# Patient Record
Sex: Male | Born: 1980 | Race: Black or African American | Hispanic: No | Marital: Single | Smoking: Current every day smoker
Health system: Southern US, Community
[De-identification: ages and names within clinical notes are randomized; demographics above are authoritative.]

## PROBLEM LIST (undated history)

## (undated) DIAGNOSIS — A6000 Herpesviral infection of urogenital system, unspecified: Secondary | ICD-10-CM

## (undated) DIAGNOSIS — H209 Unspecified iridocyclitis: Secondary | ICD-10-CM

## (undated) DIAGNOSIS — D860 Sarcoidosis of lung: Secondary | ICD-10-CM

## (undated) DIAGNOSIS — G510 Bell's palsy: Secondary | ICD-10-CM

## (undated) HISTORY — DX: Unspecified iridocyclitis: H20.9

## (undated) HISTORY — DX: Herpesviral infection of urogenital system, unspecified: A60.00

## (undated) HISTORY — DX: Bell's palsy: G51.0

## (undated) HISTORY — DX: Sarcoidosis of lung: D86.0

---

## 2000-02-04 ENCOUNTER — Emergency Department (HOSPITAL_COMMUNITY): Admission: EM | Admit: 2000-02-04 | Discharge: 2000-02-04 | Payer: Self-pay | Admitting: Emergency Medicine

## 2002-05-28 ENCOUNTER — Emergency Department (HOSPITAL_COMMUNITY): Admission: EM | Admit: 2002-05-28 | Discharge: 2002-05-28 | Payer: Self-pay | Admitting: Emergency Medicine

## 2002-05-29 ENCOUNTER — Emergency Department (HOSPITAL_COMMUNITY): Admission: EM | Admit: 2002-05-29 | Discharge: 2002-05-29 | Payer: Self-pay | Admitting: Emergency Medicine

## 2005-04-12 ENCOUNTER — Emergency Department (HOSPITAL_COMMUNITY): Admission: EM | Admit: 2005-04-12 | Discharge: 2005-04-12 | Payer: Self-pay | Admitting: Family Medicine

## 2005-12-08 ENCOUNTER — Ambulatory Visit: Payer: Self-pay | Admitting: Internal Medicine

## 2006-02-15 ENCOUNTER — Ambulatory Visit: Payer: Self-pay | Admitting: Internal Medicine

## 2006-09-14 DIAGNOSIS — H209 Unspecified iridocyclitis: Secondary | ICD-10-CM

## 2006-09-14 HISTORY — DX: Unspecified iridocyclitis: H20.9

## 2006-10-15 DIAGNOSIS — G510 Bell's palsy: Secondary | ICD-10-CM

## 2006-10-15 HISTORY — DX: Bell's palsy: G51.0

## 2006-11-02 ENCOUNTER — Ambulatory Visit: Payer: Self-pay | Admitting: Internal Medicine

## 2006-11-02 LAB — CONVERTED CEMR LAB: Sed Rate: 25 mm/hr — ABNORMAL HIGH (ref 0–20)

## 2006-11-13 ENCOUNTER — Ambulatory Visit: Payer: Self-pay | Admitting: Cardiology

## 2006-11-22 ENCOUNTER — Ambulatory Visit: Payer: Self-pay | Admitting: Internal Medicine

## 2006-11-23 ENCOUNTER — Ambulatory Visit: Admission: RE | Admit: 2006-11-23 | Discharge: 2006-11-23 | Payer: Self-pay | Admitting: Internal Medicine

## 2006-12-07 ENCOUNTER — Ambulatory Visit: Payer: Self-pay | Admitting: Internal Medicine

## 2006-12-11 DIAGNOSIS — D869 Sarcoidosis, unspecified: Secondary | ICD-10-CM

## 2006-12-13 ENCOUNTER — Ambulatory Visit: Payer: Self-pay | Admitting: Thoracic Surgery

## 2006-12-28 ENCOUNTER — Encounter: Payer: Self-pay | Admitting: Internal Medicine

## 2007-01-04 ENCOUNTER — Encounter: Payer: Self-pay | Admitting: Thoracic Surgery

## 2007-01-04 ENCOUNTER — Ambulatory Visit (HOSPITAL_COMMUNITY): Admission: RE | Admit: 2007-01-04 | Discharge: 2007-01-04 | Payer: Self-pay | Admitting: Thoracic Surgery

## 2007-01-04 ENCOUNTER — Ambulatory Visit: Payer: Self-pay | Admitting: Thoracic Surgery

## 2007-01-09 ENCOUNTER — Ambulatory Visit: Payer: Self-pay | Admitting: Thoracic Surgery

## 2007-02-15 ENCOUNTER — Ambulatory Visit: Payer: Self-pay | Admitting: Internal Medicine

## 2007-02-15 DIAGNOSIS — Z87891 Personal history of nicotine dependence: Secondary | ICD-10-CM | POA: Insufficient documentation

## 2007-02-19 ENCOUNTER — Encounter: Payer: Self-pay | Admitting: Internal Medicine

## 2007-02-20 ENCOUNTER — Ambulatory Visit: Payer: Self-pay | Admitting: Thoracic Surgery

## 2007-05-31 ENCOUNTER — Ambulatory Visit: Payer: Self-pay | Admitting: Internal Medicine

## 2007-06-26 ENCOUNTER — Telehealth: Payer: Self-pay | Admitting: Internal Medicine

## 2007-07-04 ENCOUNTER — Encounter: Payer: Self-pay | Admitting: Internal Medicine

## 2007-07-04 ENCOUNTER — Ambulatory Visit: Admission: RE | Admit: 2007-07-04 | Discharge: 2007-07-04 | Payer: Self-pay | Admitting: Internal Medicine

## 2007-07-05 ENCOUNTER — Ambulatory Visit: Payer: Self-pay | Admitting: Internal Medicine

## 2007-07-05 ENCOUNTER — Encounter: Payer: Self-pay | Admitting: Internal Medicine

## 2007-07-05 LAB — CONVERTED CEMR LAB
Albumin: 4.4 g/dL (ref 3.5–5.2)
Alkaline Phosphatase: 33 units/L — ABNORMAL LOW (ref 39–117)

## 2007-08-09 ENCOUNTER — Ambulatory Visit: Payer: Self-pay | Admitting: Internal Medicine

## 2007-08-09 LAB — CONVERTED CEMR LAB
ALT: 35 units/L (ref 0–53)
AST: 24 units/L (ref 0–37)
Albumin: 4.2 g/dL (ref 3.5–5.2)
Basophils Relative: 0.2 % (ref 0.0–1.0)
Eosinophils Relative: 0.2 % (ref 0.0–5.0)
HCT: 41.9 % (ref 39.0–52.0)
Hemoglobin: 14.1 g/dL (ref 13.0–17.0)
Lymphocytes Relative: 16.4 % (ref 12.0–46.0)
Monocytes Absolute: 0.8 10*3/uL (ref 0.1–1.0)
Monocytes Relative: 7.4 % (ref 3.0–12.0)
Neutro Abs: 7.8 10*3/uL — ABNORMAL HIGH (ref 1.4–7.7)
RBC: 4.99 M/uL (ref 4.22–5.81)
RDW: 14.6 % (ref 11.5–14.6)
Total Protein: 7.7 g/dL (ref 6.0–8.3)

## 2007-08-21 ENCOUNTER — Telehealth: Payer: Self-pay | Admitting: Internal Medicine

## 2007-09-11 ENCOUNTER — Encounter: Payer: Self-pay | Admitting: Internal Medicine

## 2007-09-13 ENCOUNTER — Ambulatory Visit: Payer: Self-pay | Admitting: Internal Medicine

## 2007-09-13 LAB — CONVERTED CEMR LAB
AST: 20 units/L (ref 0–37)
Albumin: 4.5 g/dL (ref 3.5–5.2)
Alkaline Phosphatase: 32 units/L — ABNORMAL LOW (ref 39–117)
Basophils Relative: 0.7 % (ref 0.0–3.0)
Bilirubin, Direct: 0.1 mg/dL (ref 0.0–0.3)
Eosinophils Relative: 0 % (ref 0.0–5.0)
Lymphocytes Relative: 12.3 % (ref 12.0–46.0)
Neutrophils Relative %: 79.4 % — ABNORMAL HIGH (ref 43.0–77.0)
Platelets: 308 10*3/uL (ref 150–400)
RBC: 4.99 M/uL (ref 4.22–5.81)
Total Protein: 7.7 g/dL (ref 6.0–8.3)
WBC: 11.7 10*3/uL — ABNORMAL HIGH (ref 4.5–10.5)

## 2007-10-11 ENCOUNTER — Ambulatory Visit: Payer: Self-pay | Admitting: Internal Medicine

## 2007-10-11 DIAGNOSIS — F17200 Nicotine dependence, unspecified, uncomplicated: Secondary | ICD-10-CM

## 2007-11-22 ENCOUNTER — Ambulatory Visit: Payer: Self-pay | Admitting: Internal Medicine

## 2007-11-22 LAB — CONVERTED CEMR LAB
AST: 24 units/L (ref 0–37)
Albumin: 4.1 g/dL (ref 3.5–5.2)
Alkaline Phosphatase: 37 units/L — ABNORMAL LOW (ref 39–117)
Basophils Absolute: 0 10*3/uL (ref 0.0–0.1)
Chlamydia, Swab/Urine, PCR: NEGATIVE
HCT: 43.2 % (ref 39.0–52.0)
Hemoglobin: 14.6 g/dL (ref 13.0–17.0)
Lymphocytes Relative: 25.2 % (ref 12.0–46.0)
MCHC: 33.7 g/dL (ref 30.0–36.0)
Monocytes Absolute: 0.6 10*3/uL (ref 0.1–1.0)
Monocytes Relative: 11.3 % (ref 3.0–12.0)
Neutro Abs: 3.3 10*3/uL (ref 1.4–7.7)
Platelets: 260 10*3/uL (ref 150–400)
RDW: 13.5 % (ref 11.5–14.6)
Total Protein: 7.5 g/dL (ref 6.0–8.3)

## 2008-02-14 DIAGNOSIS — D860 Sarcoidosis of lung: Secondary | ICD-10-CM

## 2008-02-14 HISTORY — DX: Sarcoidosis of lung: D86.0

## 2008-04-29 ENCOUNTER — Encounter (INDEPENDENT_AMBULATORY_CARE_PROVIDER_SITE_OTHER): Payer: Self-pay | Admitting: *Deleted

## 2008-05-05 ENCOUNTER — Telehealth: Payer: Self-pay | Admitting: Internal Medicine

## 2008-05-22 IMAGING — CR DG CHEST 2V
2 series · 2 of 2 positions shown · non-contrast
Comparison: Scout view of the chest from 11/13/06.

CLINICAL DATA: Mediastinal adenopathy.  Preadmission.  
 CHEST - 2 VIEW:

[view not recorded (1 of 2)]
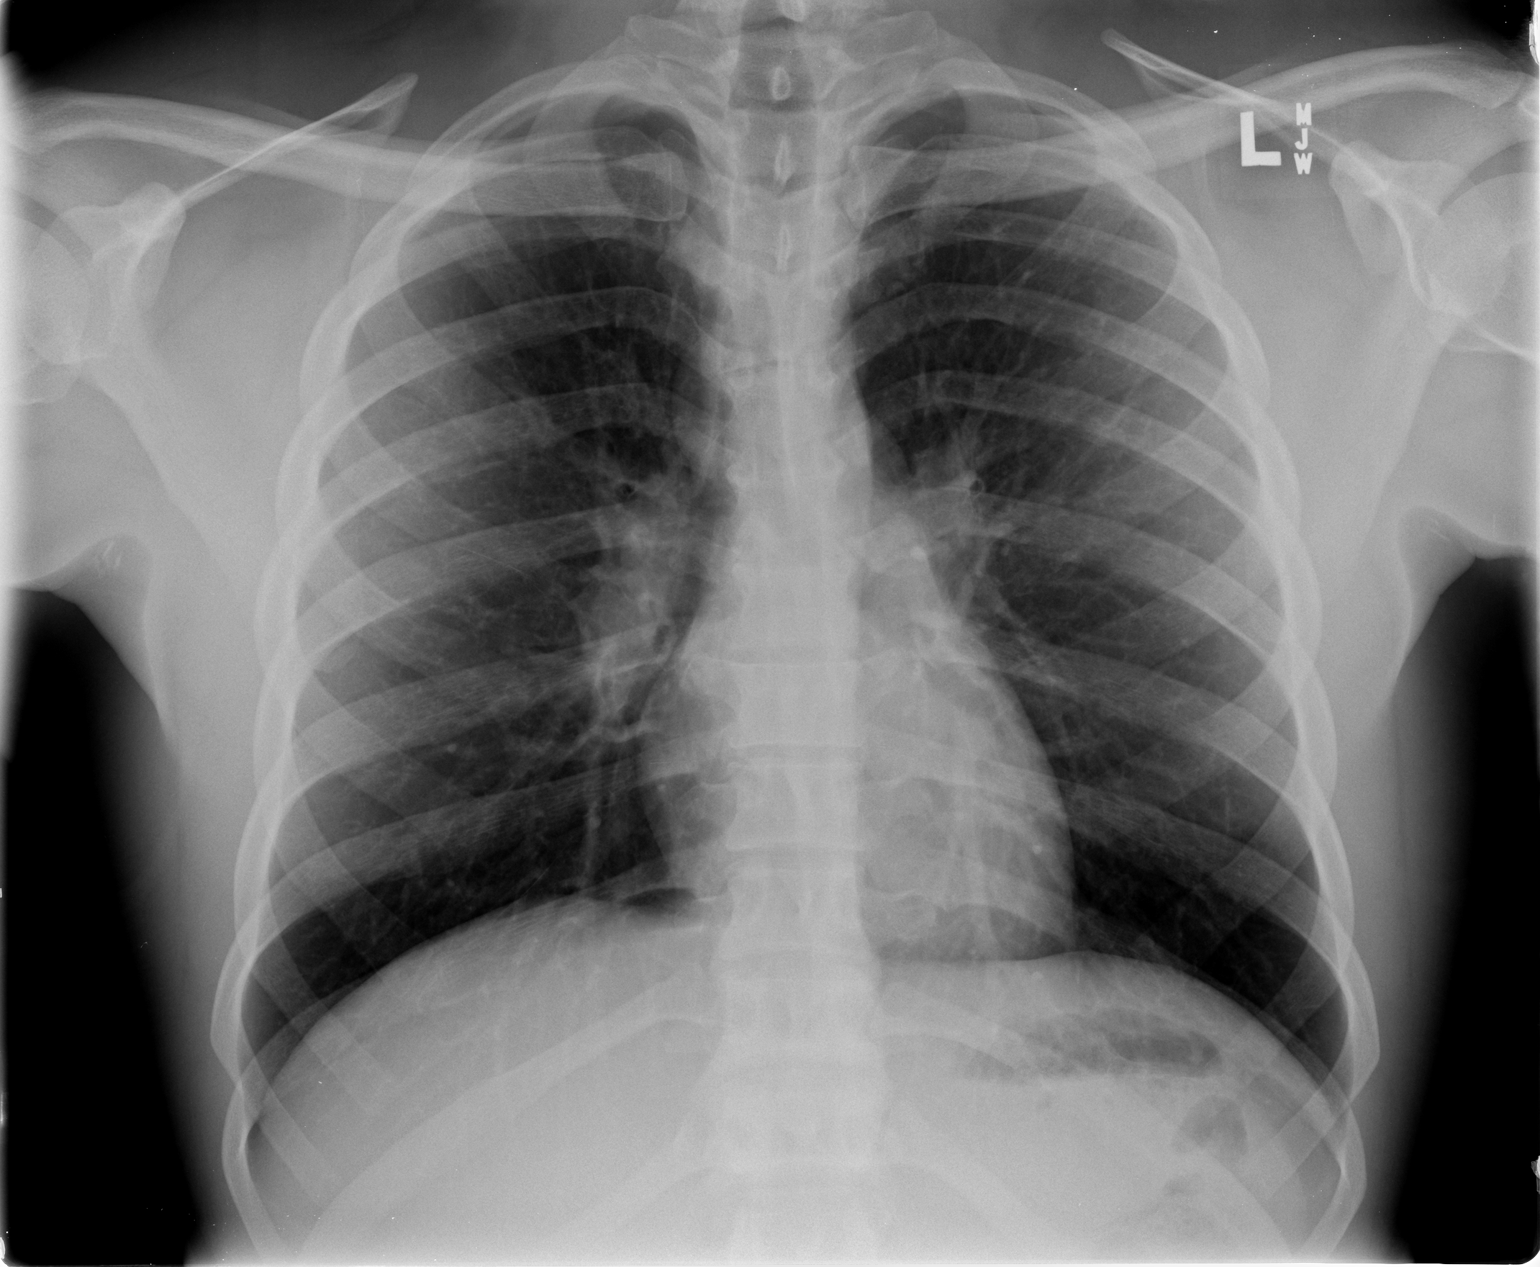

[view not recorded (2 of 2)]
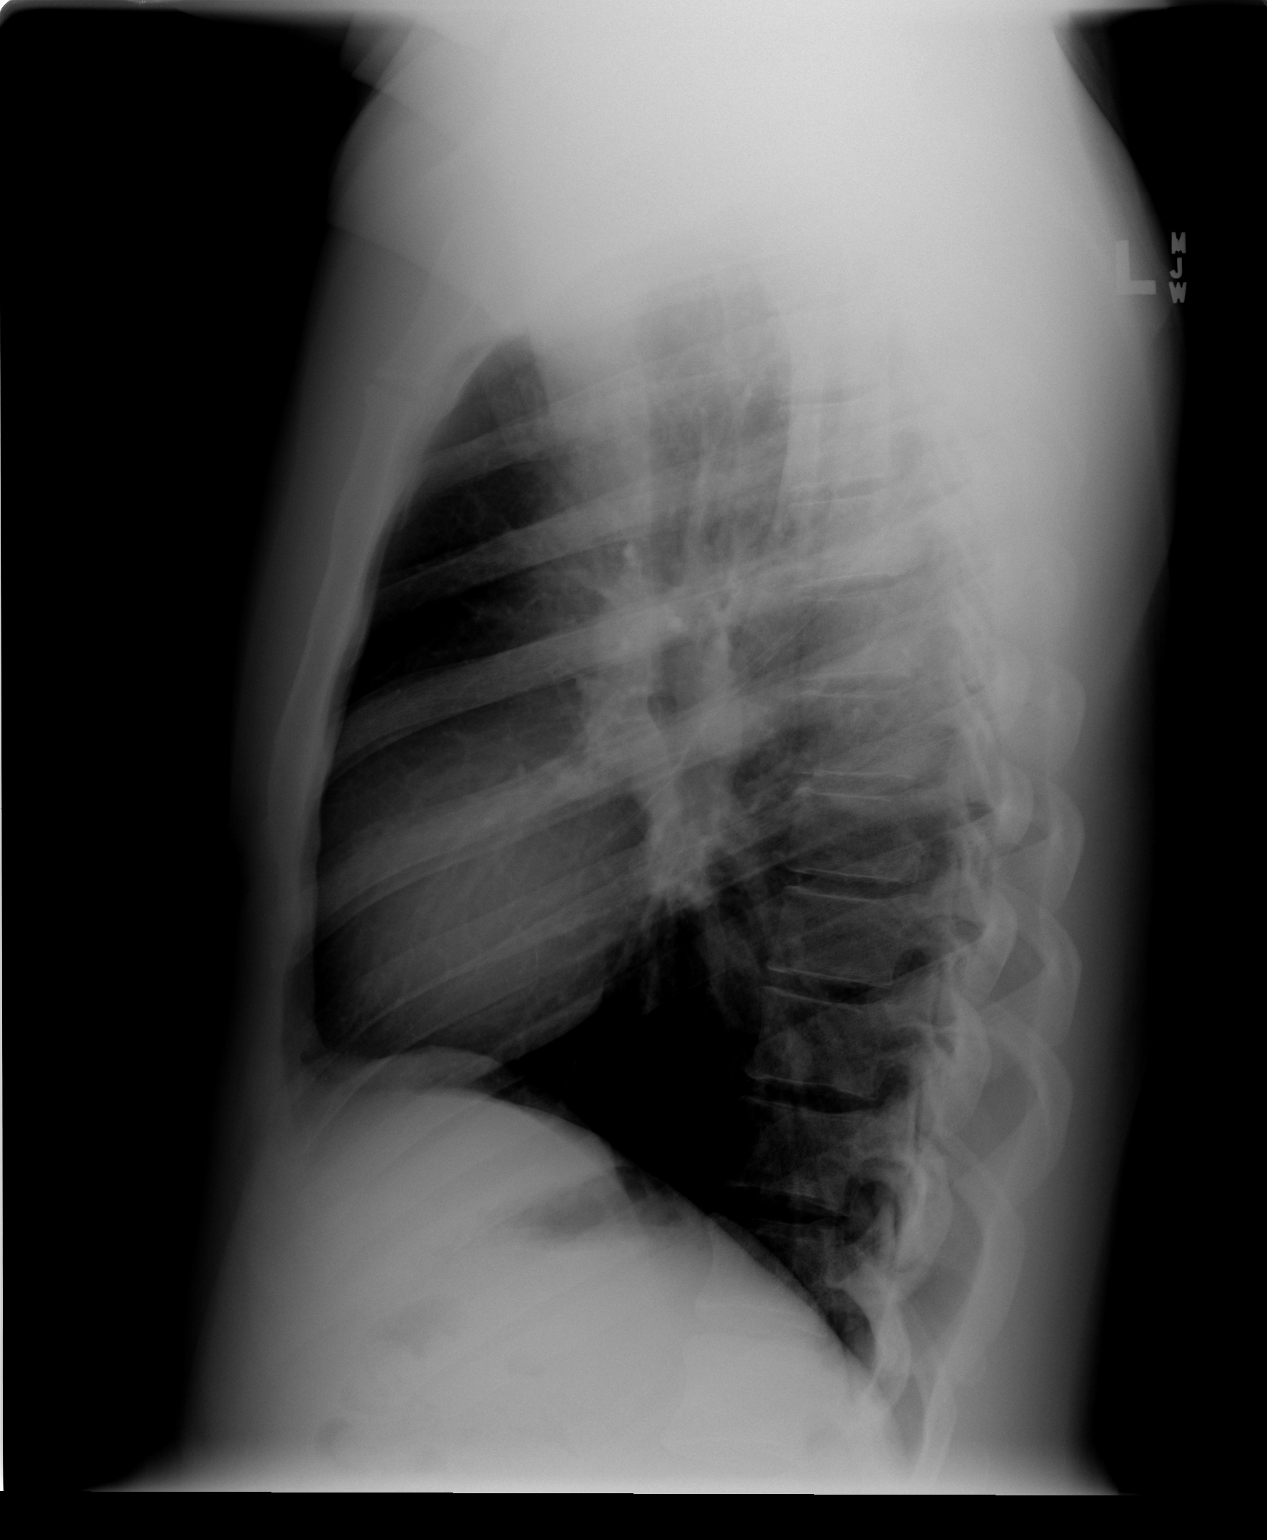

[2 of 2 positions shown; findings below may reference images not displayed]

FINDINGS: Cardiac size is within normal limits.  Mild bilateral hilar prominence is probably due to mild adenopathy.  There is no acute infiltrate or pleural effusion.  
 The bony structures are unremarkable.
IMPRESSION: No acute infiltrate or pleural effusion.  Mild bilateral hilar prominence most likely due to mild adenopathy.

## 2008-06-05 ENCOUNTER — Encounter: Payer: Self-pay | Admitting: Internal Medicine

## 2008-06-05 ENCOUNTER — Ambulatory Visit: Payer: Self-pay | Admitting: Cardiology

## 2008-06-08 ENCOUNTER — Telehealth: Payer: Self-pay | Admitting: Internal Medicine

## 2008-06-16 ENCOUNTER — Ambulatory Visit: Payer: Self-pay | Admitting: Internal Medicine

## 2008-06-17 LAB — CONVERTED CEMR LAB
BUN: 19 mg/dL (ref 6–23)
Bilirubin, Direct: 0.1 mg/dL (ref 0.0–0.3)
CO2: 30 meq/L (ref 19–32)
Chloride: 105 meq/L (ref 96–112)
Creatinine, Ser: 1.4 mg/dL (ref 0.4–1.5)
Eosinophils Absolute: 0 10*3/uL (ref 0.0–0.7)
Glucose, Bld: 90 mg/dL (ref 70–99)
HCT: 39.3 % (ref 39.0–52.0)
Lymphs Abs: 1.7 10*3/uL (ref 0.7–4.0)
MCHC: 35.1 g/dL (ref 30.0–36.0)
MCV: 82.3 fL (ref 78.0–100.0)
Monocytes Absolute: 0.9 10*3/uL (ref 0.1–1.0)
Neutrophils Relative %: 77.9 % — ABNORMAL HIGH (ref 43.0–77.0)
Platelets: 292 10*3/uL (ref 150.0–400.0)
RDW: 13.5 % (ref 11.5–14.6)
Total Bilirubin: 0.7 mg/dL (ref 0.3–1.2)

## 2008-07-10 ENCOUNTER — Ambulatory Visit: Payer: Self-pay | Admitting: Internal Medicine

## 2008-09-11 ENCOUNTER — Ambulatory Visit: Payer: Self-pay | Admitting: Cardiology

## 2008-09-21 ENCOUNTER — Encounter: Payer: Self-pay | Admitting: Internal Medicine

## 2008-09-25 ENCOUNTER — Ambulatory Visit: Payer: Self-pay | Admitting: Internal Medicine

## 2008-09-25 ENCOUNTER — Telehealth: Payer: Self-pay | Admitting: Internal Medicine

## 2008-09-25 LAB — CONVERTED CEMR LAB
ALT: 66 units/L — ABNORMAL HIGH (ref 0–53)
AST: 33 units/L (ref 0–37)
Albumin: 5 g/dL (ref 3.5–5.2)
BUN: 21 mg/dL (ref 6–23)
Chloride: 88 meq/L — ABNORMAL LOW (ref 96–112)
Eosinophils Relative: 0.4 % (ref 0.0–5.0)
GFR calc non Af Amer: 61.02 mL/min (ref >60)
Glucose, Bld: 617 mg/dL (ref 70–99)
HCT: 43.3 % (ref 39.0–52.0)
Hemoglobin: 14.3 g/dL (ref 13.0–17.0)
Hgb A1c MFr Bld: 11.4 % — ABNORMAL HIGH (ref 4.6–6.5)
Lymphs Abs: 1.7 10*3/uL (ref 0.7–4.0)
MCV: 86.3 fL (ref 78.0–100.0)
Monocytes Absolute: 0.8 10*3/uL (ref 0.1–1.0)
Monocytes Relative: 10.9 % (ref 3.0–12.0)
Neutro Abs: 4.3 10*3/uL (ref 1.4–7.7)
Platelets: 289 10*3/uL (ref 150.0–400.0)
Potassium: 3.9 meq/L (ref 3.5–5.1)
RDW: 14.1 % (ref 11.5–14.6)
Sodium: 126 meq/L — ABNORMAL LOW (ref 135–145)
Total Bilirubin: 1.7 mg/dL — ABNORMAL HIGH (ref 0.3–1.2)
WBC: 7 10*3/uL (ref 4.5–10.5)

## 2008-09-28 ENCOUNTER — Telehealth: Payer: Self-pay | Admitting: Internal Medicine

## 2008-10-02 ENCOUNTER — Telehealth: Payer: Self-pay | Admitting: Internal Medicine

## 2008-10-02 ENCOUNTER — Ambulatory Visit: Payer: Self-pay | Admitting: Internal Medicine

## 2008-10-02 ENCOUNTER — Inpatient Hospital Stay (HOSPITAL_COMMUNITY): Admission: AD | Admit: 2008-10-02 | Discharge: 2008-10-04 | Payer: Self-pay | Admitting: Internal Medicine

## 2008-10-02 DIAGNOSIS — R7309 Other abnormal glucose: Secondary | ICD-10-CM

## 2008-10-02 LAB — CONVERTED CEMR LAB
Albumin: 4.6 g/dL (ref 3.5–5.2)
Alkaline Phosphatase: 43 units/L (ref 39–117)
Basophils Absolute: 0 10*3/uL (ref 0.0–0.1)
Basophils Relative: 0.3 % (ref 0.0–3.0)
Bilirubin Urine: NEGATIVE
CO2: 23 meq/L (ref 19–32)
Calcium: 9.9 mg/dL (ref 8.4–10.5)
Chloride: 99 meq/L (ref 96–112)
Direct LDL: 252.2 mg/dL
Eosinophils Absolute: 0 10*3/uL (ref 0.0–0.7)
Glucose, Bld: 532 mg/dL (ref 70–99)
HCT: 44.4 % (ref 39.0–52.0)
Hemoglobin: 15.1 g/dL (ref 13.0–17.0)
Lymphocytes Relative: 21.4 % (ref 12.0–46.0)
Lymphs Abs: 1.2 10*3/uL (ref 0.7–4.0)
MCHC: 34 g/dL (ref 30.0–36.0)
MCV: 85.8 fL (ref 78.0–100.0)
Neutro Abs: 4.1 10*3/uL (ref 1.4–7.7)
Nitrite: NEGATIVE
Pancreatic Islet Cell Antibody: <5 (ref <5)
Potassium: 4 meq/L (ref 3.5–5.1)
RBC: 5.18 M/uL (ref 4.22–5.81)
RDW: 13.8 % (ref 11.5–14.6)
Sodium: 136 meq/L (ref 135–145)
TSH: 2.28 microintl units/mL (ref 0.35–5.50)
Total CHOL/HDL Ratio: 8
Total Protein, Urine: NEGATIVE mg/dL
Total Protein: 8.4 g/dL — ABNORMAL HIGH (ref 6.0–8.3)
Triglycerides: 328 mg/dL — ABNORMAL HIGH (ref 0.0–149.0)
Urine Glucose: >=1000 mg/dL
Urobilinogen, UA: 0.2 (ref 0.0–1.0)
Vitamin B-12: 1299 pg/mL — ABNORMAL HIGH (ref 211–911)

## 2008-10-09 ENCOUNTER — Ambulatory Visit: Payer: Self-pay | Admitting: Internal Medicine

## 2008-10-09 LAB — CONVERTED CEMR LAB: Blood Glucose, Fingerstick: 407

## 2008-10-17 ENCOUNTER — Telehealth: Payer: Self-pay | Admitting: Family Medicine

## 2008-10-20 ENCOUNTER — Telehealth: Payer: Self-pay | Admitting: Internal Medicine

## 2008-10-23 ENCOUNTER — Ambulatory Visit: Payer: Self-pay | Admitting: Internal Medicine

## 2008-10-23 LAB — CONVERTED CEMR LAB: Blood Glucose, Fingerstick: 117

## 2008-11-05 ENCOUNTER — Encounter: Admission: RE | Admit: 2008-11-05 | Discharge: 2008-11-05 | Payer: Self-pay | Admitting: Internal Medicine

## 2008-11-09 ENCOUNTER — Encounter: Payer: Self-pay | Admitting: Internal Medicine

## 2008-12-07 ENCOUNTER — Ambulatory Visit: Payer: Self-pay | Admitting: Internal Medicine

## 2009-01-01 ENCOUNTER — Ambulatory Visit: Payer: Self-pay | Admitting: Internal Medicine

## 2009-01-01 LAB — CONVERTED CEMR LAB
ALT: 26 units/L (ref 0–53)
AST: 27 units/L (ref 0–37)
Alkaline Phosphatase: 33 units/L — ABNORMAL LOW (ref 39–117)
Basophils Relative: 0.7 % (ref 0.0–3.0)
Bilirubin, Direct: 0.1 mg/dL (ref 0.0–0.3)
Calcium: 9.5 mg/dL (ref 8.4–10.5)
Creatinine, Ser: 1.2 mg/dL (ref 0.4–1.5)
Eosinophils Absolute: 0.1 10*3/uL (ref 0.0–0.7)
Eosinophils Relative: 1.6 % (ref 0.0–5.0)
Lymphocytes Relative: 27.9 % (ref 12.0–46.0)
Monocytes Relative: 9 % (ref 3.0–12.0)
Neutrophils Relative %: 60.8 % (ref 43.0–77.0)
RBC: 4.88 M/uL (ref 4.22–5.81)
Sodium: 141 meq/L (ref 135–145)
Total Protein: 7.5 g/dL (ref 6.0–8.3)
WBC: 4.9 10*3/uL (ref 4.5–10.5)

## 2009-01-14 ENCOUNTER — Telehealth: Payer: Self-pay | Admitting: Internal Medicine

## 2009-03-12 ENCOUNTER — Ambulatory Visit: Payer: Self-pay | Admitting: Internal Medicine

## 2009-03-26 ENCOUNTER — Telehealth (INDEPENDENT_AMBULATORY_CARE_PROVIDER_SITE_OTHER): Payer: Self-pay | Admitting: *Deleted

## 2009-04-23 ENCOUNTER — Telehealth: Payer: Self-pay | Admitting: Internal Medicine

## 2009-04-30 ENCOUNTER — Ambulatory Visit: Payer: Self-pay | Admitting: Internal Medicine

## 2009-05-05 ENCOUNTER — Telehealth: Payer: Self-pay | Admitting: Internal Medicine

## 2009-05-05 LAB — CONVERTED CEMR LAB
ALT: 27 units/L (ref 0–53)
AST: 27 units/L (ref 0–37)
Eosinophils Absolute: 0 10*3/uL (ref 0.0–0.7)
Eosinophils Relative: 0.8 % (ref 0.0–5.0)
GFR calc non Af Amer: 90.85 mL/min (ref >60)
Glucose, Bld: 102 mg/dL — ABNORMAL HIGH (ref 70–99)
Hgb A1c MFr Bld: 5.8 % (ref 4.6–6.5)
MCV: 85.1 fL (ref 78.0–100.0)
Monocytes Absolute: 0.6 10*3/uL (ref 0.1–1.0)
Neutrophils Relative %: 66.7 % (ref 43.0–77.0)
Platelets: 216 10*3/uL (ref 150.0–400.0)
Potassium: 3.8 meq/L (ref 3.5–5.1)
Sodium: 139 meq/L (ref 135–145)
Total Bilirubin: 0.5 mg/dL (ref 0.3–1.2)
WBC: 4.9 10*3/uL (ref 4.5–10.5)

## 2009-11-10 ENCOUNTER — Encounter: Payer: Self-pay | Admitting: Internal Medicine

## 2009-11-10 ENCOUNTER — Ambulatory Visit: Payer: Self-pay | Admitting: Internal Medicine

## 2009-11-10 DIAGNOSIS — N342 Other urethritis: Secondary | ICD-10-CM

## 2009-11-10 DIAGNOSIS — R079 Chest pain, unspecified: Secondary | ICD-10-CM

## 2009-11-10 LAB — CONVERTED CEMR LAB: GC Probe Amp, Urine: NEGATIVE

## 2009-11-28 IMAGING — CR DG CHEST 2V
2 series · 2 of 2 positions shown · non-contrast
Comparison: 06/05/2008 and earlier.

CLINICAL DATA: 28-year-old male with history of sarcoid.  Ex-
smoker.

CHEST - 2 VIEW

[view not recorded (1 of 2)]
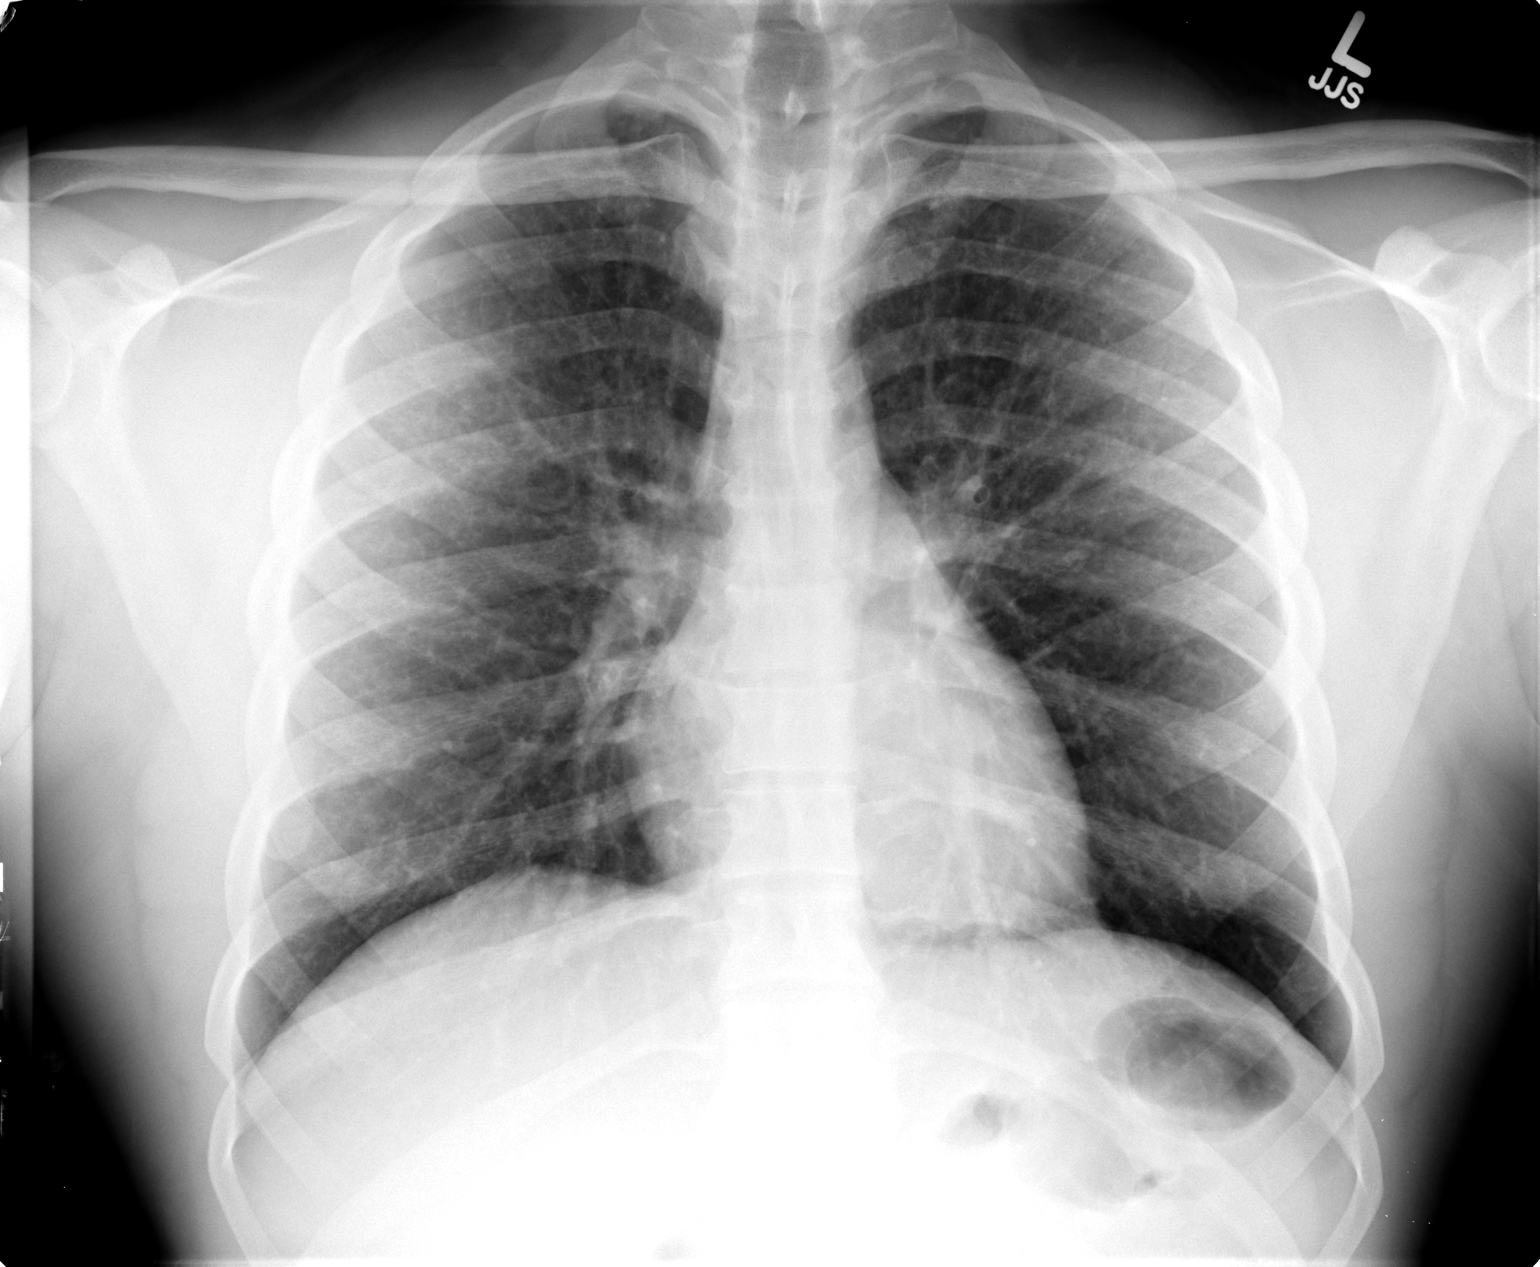

[view not recorded (2 of 2)]
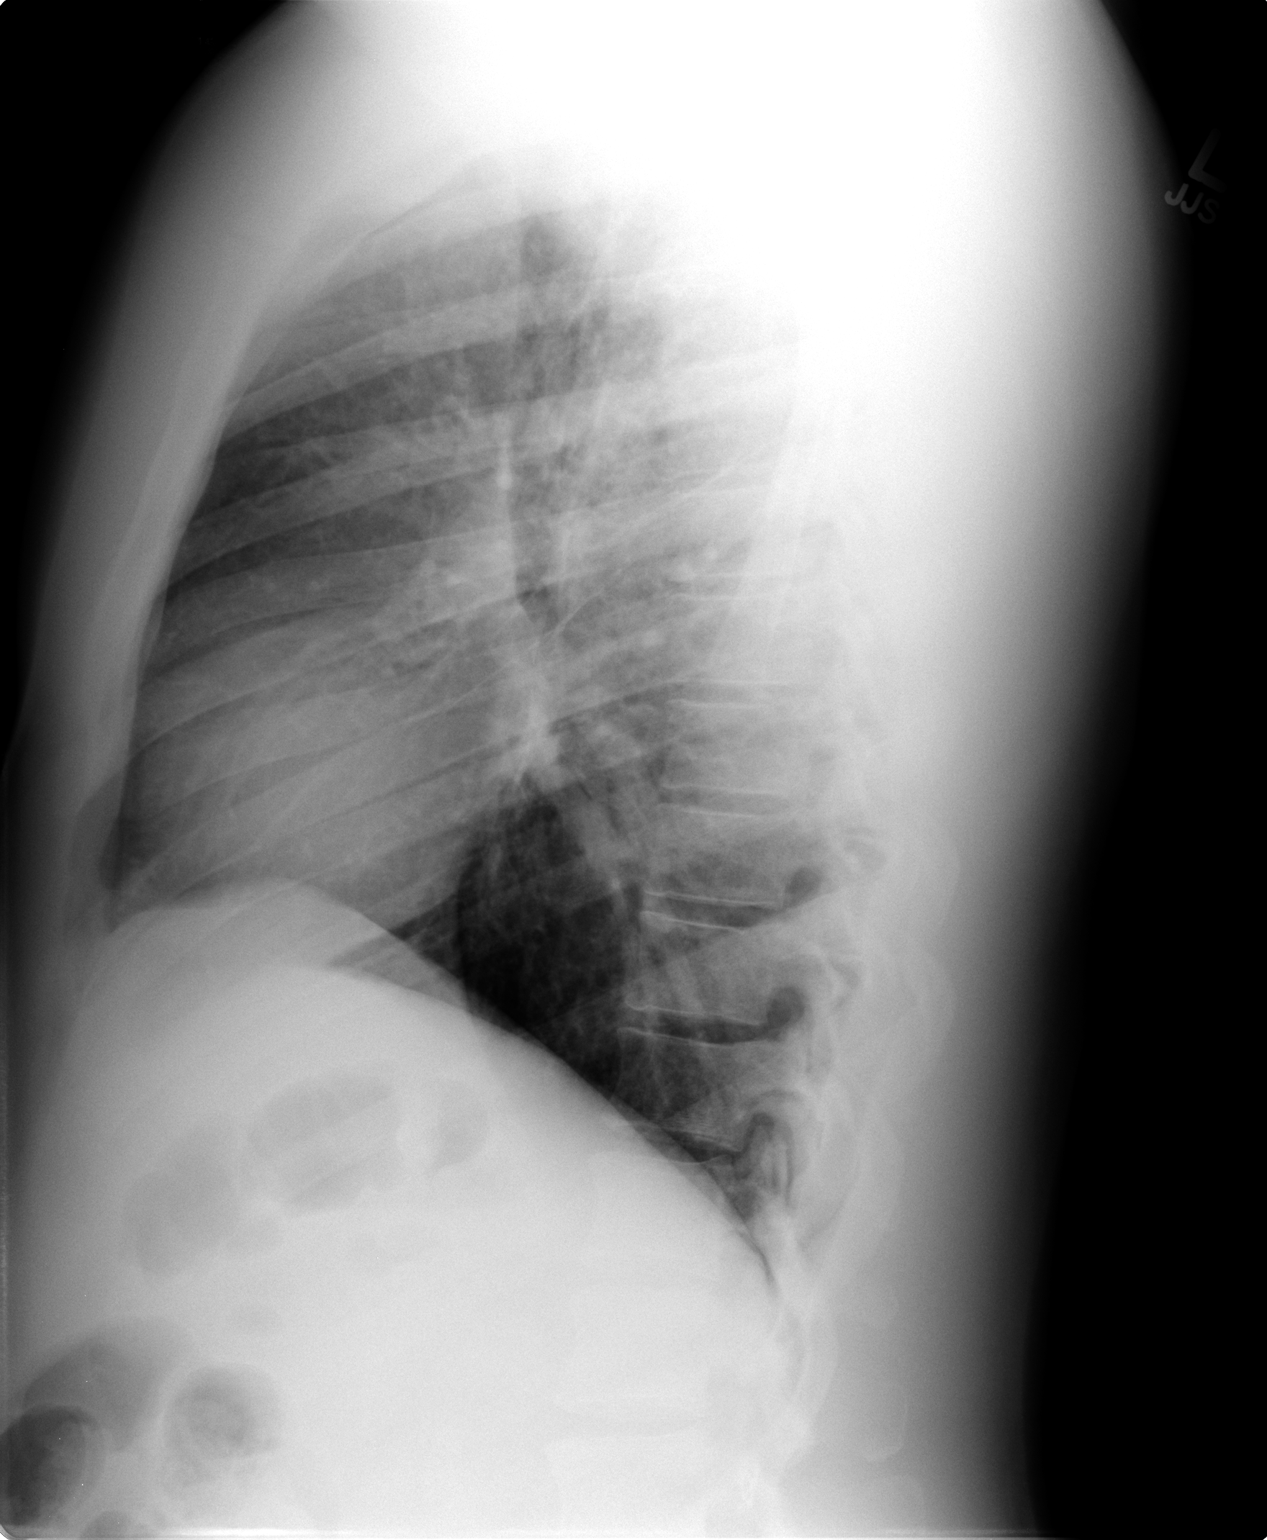

[2 of 2 positions shown; findings below may reference images not displayed]

FINDINGS: Stable lung volumes.  Cardiac size and mediastinal
contours are within normal limits.  Visualized tracheal air column
is within normal limits.  No pneumothorax, pulmonary edema, pleural
effusion, or confluent airspace opacity.  Nodular interstitial
disease has increased since the prior radiographs in the 7774,
probably not significantly changed from the recent chest CT. No
acute osseous abnormality identified.
IMPRESSION: Pulmonary interstitial disease compatible with sarcoidosis.
Otherwise, No acute cardiopulmonary abnormality.

## 2009-12-13 ENCOUNTER — Telehealth: Payer: Self-pay | Admitting: Internal Medicine

## 2009-12-31 ENCOUNTER — Ambulatory Visit: Payer: Self-pay | Admitting: Internal Medicine

## 2010-01-05 ENCOUNTER — Ambulatory Visit: Payer: Self-pay | Admitting: Internal Medicine

## 2010-01-19 ENCOUNTER — Telehealth: Payer: Self-pay | Admitting: Internal Medicine

## 2010-03-06 ENCOUNTER — Encounter: Payer: Self-pay | Admitting: Internal Medicine

## 2010-03-13 LAB — CONVERTED CEMR LAB
AST: 25 units/L (ref 0–37)
Albumin: 4.6 g/dL (ref 3.5–5.2)
Alkaline Phosphatase: 40 units/L (ref 39–117)
Alkaline Phosphatase: 55 units/L (ref 39–117)
Angiotensin 1 Converting Enzyme: 44 units/L (ref 9–67)
Anti Nuclear Antibody(ANA): NEGATIVE
BUN: 16 mg/dL (ref 6–23)
BUN: 7 mg/dL (ref 6–23)
Basophils Absolute: 0 10*3/uL (ref 0.0–0.1)
Basophils Absolute: 0 10*3/uL (ref 0.0–0.1)
CO2: 30 meq/L (ref 19–32)
Calcium: 10 mg/dL (ref 8.4–10.5)
Cholesterol: 276 mg/dL — ABNORMAL HIGH (ref 0–200)
Creatinine, Ser: 1.4 mg/dL (ref 0.4–1.5)
Eosinophils Absolute: 0 10*3/uL (ref 0.0–0.7)
GFR calc Af Amer: 85 mL/min
GFR calc non Af Amer: 76.41 mL/min (ref >60)
Glucose, Bld: 116 mg/dL — ABNORMAL HIGH (ref 70–99)
HDL: 37.7 mg/dL — ABNORMAL LOW (ref >39.00)
Hemoglobin: 14.3 g/dL (ref 13.0–17.0)
Lymphocytes Relative: 13.9 % (ref 12.0–46.0)
Lymphocytes Relative: 21.2 % (ref 12.0–46.0)
MCHC: 33.8 g/dL (ref 30.0–36.0)
Monocytes Absolute: 0.5 10*3/uL (ref 0.2–0.7)
Monocytes Relative: 8.9 % (ref 3.0–11.0)
Monocytes Relative: 9 % (ref 3.0–12.0)
Neutro Abs: 4 10*3/uL (ref 1.4–7.7)
Nitrite: NEGATIVE
Platelets: 250 10*3/uL (ref 150.0–400.0)
Potassium: 3.7 meq/L (ref 3.5–5.1)
RDW: 15.1 % — ABNORMAL HIGH (ref 11.5–14.6)
Relative Index: 0.9 (ref 0.0–2.5)
Rhuematoid fact SerPl-aCnc: <20 intl units/mL — ABNORMAL LOW (ref 0.0–20.0)
Sodium: 139 meq/L (ref 135–145)
Specific Gravity, Urine: 1.015 (ref 1.000–1.030)
TSH: 1.65 microintl units/mL (ref 0.35–5.50)
Total Bilirubin: 0.7 mg/dL (ref 0.3–1.2)
Total Protein: 8.7 g/dL — ABNORMAL HIGH (ref 6.0–8.3)
Triglycerides: 166 mg/dL — ABNORMAL HIGH (ref 0.0–149.0)
Urine Glucose: NEGATIVE mg/dL
Urobilinogen, UA: 0.2 (ref 0.0–1.0)

## 2010-03-15 NOTE — Letter (Signed)
Summary: Lipid Letter  [facility]  [address] [place], [place] [address]   Phone: [phone]  Fax: [phone]    11/10/2009  [facility] [address] [place], [place]  [address]  Dear Alvester Chou:  We have carefully reviewed your last lipid profile from  and the results are noted below with a summary of recommendations for lipid management.    Cholesterol:       276     Goal: <200   HDL "good" Cholesterol:   57.84     Goal: >40   LDL "bad" Cholesterol:   229     Goal: <130 WOW   Triglycerides:       166.0     Goal: <150        TLC Diet (Therapeutic Lifestyle Change): Saturated Fats & Transfatty acids should be kept < 7% of total calories ***Reduce Saturated Fats Polyunstaurated Fat can be up to 10% of total calories Monounsaturated Fat Fat can be up to 20% of total calories Total Fat should be no greater than 25-35% of total calories Carbohydrates should be 50-60% of total calories Protein should be approximately 15% of total calories Fiber should be at least 20-30 grams a day ***Increased fiber may help lower LDL Total Cholesterol should be < 200mg /day Consider adding plant stanol/sterols to diet (example: Benacol spread) ***A higher intake of unsaturated fat may reduce Triglycerides and Increase HDL    Adjunctive Measures (may lower LIPIDS and reduce risk of Heart Attack) include: Aerobic Exercise (20-30 minutes 3-4 times a week) Limit Alcohol Consumption Weight Reduction Aspirin 75-81 mg a day by mouth (if not allergic or contraindicated) Dietary Fiber 20-30 grams a day by mouth     Current Medications: 1)    Onetouch Basic System W/device Kit (Blood glucose monitoring suppl) .... Use this device to moniter fasting am blood glucoses 2)    Onetouch Test  Strp (Glucose blood) .... Check blood glucose one to two times a day 3)    Humalog Mix 75/25 Pen 75-25 % Susp (Insulin lispro prot & lispro) .... 20 units in the am and 10 units in the  pm 4)    Bd Pen Needle Mini U/f 31g X 5 Mm Misc (Insulin pen needle) .... Use one bid  If you have any questions, please call. We appreciate being able to work with you.   Sincerely,    [facility] Etta Grandchild MD

## 2010-03-15 NOTE — Letter (Signed)
Summary: Results Follow-up Letter  [facility]  [address] [place], [place] [address]   Phone: [phone]  Fax: [phone]    11/10/2009  [address] [place], [place]  [address]  Dear Mr. [facility],   The following are the results of your recent test(s):  Test     Result     Chest Xray     sarcoid has worsened Blood sugars   normal Liver/kidney   normal CBC       normal Thyroid     normal  _________________________________________________________  Please call for an appointment in 1-2 months _________________________________________________________ _________________________________________________________ _________________________________________________________  Sincerely,  Sanda Linger MD [facility]

## 2010-03-15 NOTE — Assessment & Plan Note (Signed)
Summary: rov///kp   Visit Type:  Follow-up Copy to:  Dr. Artist Pais Primary Provider/Referring Provider:  Etta Grandchild MD  CC:  Pt here for 8 month follow-up. Pt is still smoking.[name] [name]  History of Present Illness: 30yo AAM hx of :  Stage II Pulmonary Sarcoidosis; Mediastinal bx proven. Ct with subtle pulm infiltrates . 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 No evidence of neurosarcoid Fall 2008   OV 04/28/2009:  Followup sarcoidosis, monitoring for pred/methotrexate Rx, tobacco abuse. Continues to be asymptomatic without dyspnea or cough from sarcoid standpoint. HE is doing all ADL's, working out in gym, attends work and feels great. He has now completed 22 months (nearly) of methotrexate except for one gap in interim. No longer taking prednisone since august 2010. Sugars at home are under control and he is not taking metformin anymore. He stil continues to smoke. States that chantix although effective gives him bad dreams. Wants an alternative. He has had PFTs today; this is NORMAL and much improved from May 2009 the date of his last PFT. CT Jan 2011, shows stability in mild Pulmonary Upper Lobe infiltrates since July 2010. REC: EXPECTANT FOLLOWUP   December 31, 2009: Followup Sarcoid and Tobabcco Abuse. Saw PMD on 11/10/2009 for some vague atypical chest pain. CXR shows worsening sarcoid. Chest pain now resolved. Howeer, is experienceing several weeks of very rare mild dyspnea at work when he lifts heavy objects. It is very mild that it is hardly noticeable. Feels great otherwise. WEight stable. Still smoking and struggling to quit.     Preventive Screening-Counseling & Management  Alcohol-Tobacco     Alcohol drinks/day: 0     Smoking Status: quit     Packs/Day: 1/2 ppd     Pack years: 9     Passive Smoke Exposure: no     Tobacco Counseling: to quit use of tobacco  products  Comments: relapsed after quiting in 2008  Current Medications (verified): 1)  Valtrex 1 Gm Tabs (Valacyclovir Hcl) .... Take 1 Tablet By Mouth Once A Day X 5 Days  Allergies (verified): 1)  ! Prednisone  Past History:  Past medical, surgical, family and social histories (including risk factors) reviewed, and no changes noted (except as noted below).  Past Medical History: Reviewed history from 04/30/2009 and no changes required. #SARCOID 1. Iritis both eyes - aug 2008. Being followed by [facility]. Dr. Francee Piccolo. Says dx is sarcoid. Says that inflammation was considered mild. Being followed. Got eye drops 2. Bells Palsy - Sept 2008. Resolved. 3. 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 No evidence of neurosarcoid Fall 2008 - Commenced methotrexate/pred/folic acid 07/05/2007 - Followup CT 08/2007 with clearance - Taper and DC of prednisone by 11/23/2007. Left on 15mg  per week methotrexate alone - Asymptomatic relapse of pulmonary stage 2 sarcoid on CT 06/05/2008 - REcommenced pred 60mg  06/06/2008. Continue methotrexat 15mg  per week/daily folic acid - May 2010: Taper prednisone, continue mtx 15mg  per week/daily folic acid  - July 2010: CT with very significant improvement in sarcoid - Aug 2010: Developed Diabetes. Refused to take prednisone. Continued on methotrexated -  Jan 2011: CT shows stable disease - mild stage 2 - March 2011: Asymptomatic and normal PFTs (PFT improved compared to  May 2009) - STOP ALL RX #. TOBACCO ABUSE....[name] KitchenMarland KitchenRmaswamy > Commenced chantix 05/2008? spcific date ...stopped due to intolerance ? 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 On metformin > Stopped metformin by March 2011. HgbA1C ordered  # REcurrent Genital Herpes - Q5 months since 2003. Takes Valtrex as  needed  Past Surgical History: Reviewed history from 10/02/2008 and no changes required. Denies surgical history  Family History: Reviewed history from 10/02/2008 and no changes required. 1. Denies any sarcoid, asthma or COPD in the family.   2. Maternal grandmother had cirrhosis of the liver from a lot of       alcohol abuse.  mother has HTN sibling has calcium deficiency Family History Diabetes 1st degree relative  Social History: Reviewed history from 07/10/2008 and no changes required. Updatd 12/31/2009 1. He lives with his girlfriend.  2. He works as a Nature conservation officer loading trucks.   3. He has not had any travel in recent months.   4. He has 1  child 5.Pt is currently smoking 6. etoh on weekends 7. Takes annual STD Screen  Review of Systems       The patient complains of dyspnea on exertion.  The patient denies anorexia, fever, weight loss, weight gain, vision loss, decreased hearing, hoarseness, chest pain, syncope, peripheral edema, prolonged cough, headaches, hemoptysis, abdominal pain, melena, hematochezia, severe indigestion/heartburn, hematuria, incontinence, genital sores, muscle weakness, suspicious skin lesions, transient blindness, difficulty walking, depression, unusual weight change, abnormal bleeding, enlarged lymph nodes, angioedema, breast masses, and testicular masses.    Vital Signs:  Patient profile:   30 year old male Height:      73 inches Weight:      197.25 pounds BMI:     26.12 O2 Sat:      98 % on Room air Temp:     98.2 degrees F oral Pulse rate:   84 / minute BP sitting:   128 / 72  (right arm) Cuff size:   regular  Vitals Entered By: Carron Curie CMA (December 31, 2009 2:35 PM)  O2 Flow:  Room air CC: Pt here for 8 month follow-up. Pt is still smoking. Comments Medications reviewed with patient Carron Curie CMA  December 31, 2009 2:37 PM Daytime phone number verified with patient.    Physical Exam  General:  well developed,  well nourished, in no acute distress Head:  normocephalic and atraumatic. some acne in forehead Eyes:  PERRLA/EOM intact; conjunctiva and sclera clear Ears:  TMs intact and clear with normal canals Nose:  no deformity, discharge, inflammation, or lesions Mouth:  no deformity or lesions Neck:  no masses, thyromegaly, or abnormal cervical nodes. Keloid scar from mediastinoscopy present Chest Wall:  no deformities noted Lungs:  clear bilaterally to auscultation and percussion Heart:  regular rate and rhythm, S1, S2 without murmurs, rubs, gallops, or clicks Abdomen:  bowel sounds positive; abdomen soft and non-tender without masses, or organomegaly Msk:  no deformity or scoliosis noted with normal posture Pulses:  pulses normal Extremities:  no clubbing, cyanosis, edema, or deformity noted Neurologic:  CN II-XII grossly intact with normal reflexes, coordination, muscle strength and tone Skin:  intact without lesions or rashes Cervical Nodes:  no significant adenopathy Psych:  alert and cooperative; normal attention span and concentration anxious.     CXR  Procedure date:  11/10/2009  Findings:      increase in pulmonary infiltrates  Impression & Recommendations:  Problem # 1:  TOBACCO ABUSE-HISTORY OF (ICD-V15.82)  Assessment Deteriorated 3 minute counselling to quit. He will try on his own. REfusing help  Problem # 2:  SARCOIDOSIS (ICD-135) Assessment: Deteriorated  Stage 2 Pulmonary sarcoid. Asymptomatic in March 2011 and . PFTs are normal in March 2011. At that time he had  finished nearly 22 month Rx with methotrxat and cumulative >1 year treatment wtih steroids. So, we are monitoring him. Currently mild dyspnea and CXR sept 2011 shows  deterioration all c/w sarcoid recurence/progression  plan Will get PFTs. IF it shows deterioration  he needs immnomodulatory Rx. He is REFUSING FLAT OUT to go back on steroids even at low dose or quick taper. HE is interested in INFLIXIMAB but I  warned him of unsual infections. HE will have PFTs and will talk to his signoficant other about which line of Rx. If he favors INFLIXIMAB I will refer him to Dr. Azzie Roup Rheum  Other Orders: Pulmonary Referral (Pulmonary) Est. Patient Level IV (60109) Tobacco use cessation intermediate 3-10 minutes (32355)  Patient Instructions: 1)  please have full breathing test 2)  as soon as that is done please tell the tester to fax it to me or put it on my desk 3)  i will call you with results and decide referrral versus continued watching 4)  try to quit smoking

## 2010-03-15 NOTE — Miscellaneous (Signed)
Summary: Orders Update pft charges  Clinical Lists Changes  Orders: Added new Service order of Carbon Monoxide diffusing w/capacity (94720) - Signed Added new Service order of Lung Volumes (94240) - Signed Added new Service order of Spirometry (Pre & Post) (94060) - Signed 

## 2010-03-15 NOTE — Progress Notes (Signed)
Summary: April 30, 2009 followup  ---- Converted from flag ---- ---- 04/21/2009 11:26 AM, Carron Curie CMA wrote:   ---- 04/21/2009 10:44 AM, Carron Curie CMA wrote: Just FYI pt ahs f/u appt on March 18. ------------------------------

## 2010-03-15 NOTE — Assessment & Plan Note (Signed)
Summary: rov/pft/apc   Visit Type:  Follow-up Copy to:  Dr. Artist Pais Primary Provider/Referring Provider:  Etta Grandchild MD  CC:  Pt here for follow-up and discuss PFT results. .  History of Present Illness: 30yo AAM hx of :  Stage II Pulmonary Sarcoidosis; Mediastinal bx proven. Ct with subtle pulm infiltrates . 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 No evidence of neurosarcoid Fall 2008   OV 04/28/2009:  Followup sarcoidosis, monitoring for pred/methotrexate Rx, tobacco abuse. Continues to be asymptomatic without dyspnea or cough from sarcoid standpoint. HE is doing all ADL's, working out in gym, attends work and feels great. He has now completed 22 months (nearly) of methotrexate except for one gap in interim. No longer taking prednisone since august 2010. Sugars at home are under control and he is not taking metformin anymore. He stil continues to smoke. States that chantix although effective gives him bad dreams. Wants an alternative. He has had PFTs today; this is NORMAL and much improved from May 2009 the date of his last PFT. CT Jan 2011, shows stability in mild Pulmonary Upper Lobe infiltrates since July 2010.      Pulmonary Function Test Date: 04/30/2009 Height (in.): 73 Gender: Male  Pre-Spirometry FVC    Value: 4.95 L/min   Pred: 5.81 L/min     % Pred: 85 % FEV1    Value: 4.19 L     Pred: 4.50 L     % Pred: 93 % FEV1/FVC  Value: 85 %     Pred: 77 %     % Pred: 0 % FEF 25-75  Value: 4.75 L/min   Pred: 4.59 L/min     % Pred: 103 %  Lung Volumes TLC    Value: 6.73 L   % Pred: 86 % RV    Value: 1.78 L   % Pred: 86 % DLCO    Value: 29.5 %   % Pred: 90 % DLCO/VA  Value: 29.5 %   % Pred: 90 %  Comments: much improved in FVC, Fev1, TLC and DLCO cmoparted to MAy 2009  Evaluation: normal  Current Medications (verified): 1)  Methotrexate 2.5 Mg  Tabs  (Methotrexate Sodium) .... Take As Directed. 2)  Folic Acid 800 Mcg  Tabs (Folic Acid) .... Take 1 Tablet By Mouth Two Times A Day 3)  Onetouch Basic System W/device Kit (Blood Glucose Monitoring Suppl) .... Use This Device To Moniter Fasting Am Blood Glucoses 4)  Onetouch Test  Strp (Glucose Blood) .... Check Blood Glucose One To Two Times A Day 5)  Humalog Mix 75/25 Pen 75-25 % Susp (Insulin Lispro Prot & Lispro) .... 20 Units in The Am and 10 Units in The Pm 6)  Bd Pen Needle Mini U/f 31g X 5 Mm Misc (Insulin Pen Needle) .... Use One Bid  Allergies (verified): No Known Drug Allergies  Past History:  Family History: Last updated: 10/02/2008 1. Denies any sarcoid, asthma or COPD in the family.   2. Maternal grandmother had cirrhosis of the liver from a lot of       alcohol abuse.  mother has HTN sibling has calcium deficiency Family History Diabetes 1st degree relative  Social History: Last updated: 07/10/2008 Updatd 07/10/2008 1. He lives with his girlfriend.  2. He works as a Nature conservation officer loading trucks.   3. He has not had any travel in recent  months.   4. He has 1  child 5.Quit smoking 06/26/2008 6. etoh on weekends 7. Takes annual STD Screen  Risk Factors: Alcohol Use: 0 (01/01/2009)  Risk Factors: Smoking Status: quit (01/01/2009) Packs/Day: 1/2 ppd (01/01/2009) Passive Smoke Exposure: no (01/01/2009)  Past Medical History: #SARCOID 1. Iritis both eyes - aug 2008. Being followed by [facility] Dr. Francee Piccolo. Says dx is sarcoid. Says that inflammation was considered mild. Being followed. Got eye drops 2. Bells Palsy - Sept 2008. Resolved. 3. 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 No evidence of neurosarcoid Fall 2008 - Commenced methotrexate/pred/folic acid 07/05/2007 - Followup CT 08/2007 with clearance - Taper  and DC of prednisone by 11/23/2007. Left on 15mg  per week methotrexate alone - Asymptomatic relapse of pulmonary stage 2 sarcoid on CT 06/05/2008 - REcommenced pred 60mg  06/06/2008. Continue methotrexat 15mg  per week/daily folic acid - May 2010: Taper prednisone, continue mtx 15mg  per week/daily folic acid  - July 2010: CT with very significant improvement in sarcoid - Aug 2010: Developed Diabetes. Refused to take prednisone. Continued on methotrexated -  Jan 2011: CT shows stable disease - mild stage 2 - March 2011: Asymptomatic and normal PFTs (PFT improved compared to May 2009) - STOP ALL RX #. TOBACCO ABUSE....Marland KitchenMarland KitchenRmaswamy > Commenced chantix 05/2008? spcific date ...stopped due to intolerance ? 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 On metformin > Stopped metformin by March 2011. HgbA1C ordered  # REcurrent Genital Herpes - Q5 months since 2003. Takes Valtrex as needed  Past Surgical History: Reviewed history from 10/02/2008 and no changes required. Denies surgical history  Family History: Reviewed history from 10/02/2008 and no changes required. 1. Denies any sarcoid, asthma or COPD in the family.   2. Maternal grandmother had cirrhosis of the liver from a lot of       alcohol abuse.  mother has HTN sibling has calcium deficiency Family History Diabetes 1st degree relative  Social History: Reviewed history from 07/10/2008 and no changes required. Updatd 07/10/2008 1. He lives with his girlfriend.  2. He works as a Nature conservation officer loading trucks.   3. He has not had any travel in recent months.   4. He has 1  child 5.Quit smoking 06/26/2008 6. etoh on weekends 7. Takes annual STD Screen  Review of Systems  The patient denies anorexia, fever, weight loss, weight gain, vision loss, decreased hearing, hoarseness, chest pain, syncope, dyspnea on exertion, peripheral edema, prolonged cough, headaches, hemoptysis, abdominal pain, melena, hematochezia,  severe indigestion/heartburn, hematuria, incontinence, genital sores, muscle weakness, suspicious skin lesions, transient blindness, difficulty walking, depression, unusual weight change, abnormal bleeding, enlarged lymph nodes, angioedema, breast masses, and testicular masses.    Vital Signs:  Patient profile:   30 year old male Height:      73 inches Weight:      195 pounds BMI:     25.82 O2 Sat:      99 % Temp:     98.2 degrees F oral Pulse rate:   94 / minute BP sitting:   118 / 74  (right arm) Cuff size:   regular  Vitals Entered By: Carron Curie CMA (April 30, 2009 1:37 PM) CC: Pt here for follow-up and discuss PFT results.  Comments Medications reviewed with patient Carron Curie CMA  April 30, 2009 1:38 PM  Daytime phone number verified with patient.    Physical Exam  General:  well developed, well nourished, in no acute distress Head:  normocephalic and atraumatic. some acne in forehead Eyes:  PERRLA/EOM intact; conjunctiva and sclera clear Ears:  TMs intact and clear with normal canals Nose:  no deformity, discharge, inflammation, or lesions Mouth:  no deformity or lesions Neck:  no masses, thyromegaly, or abnormal cervical nodes. Keloid scar from mediastinoscopy present Chest Wall:  no deformities noted Lungs:  clear bilaterally to auscultation and percussion Heart:  regular rate and rhythm, S1, S2 without murmurs, rubs, gallops, or clicks Abdomen:  bowel sounds positive; abdomen soft and non-tender without masses, or organomegaly Msk:  no deformity or scoliosis noted with normal posture Pulses:  pulses normal Extremities:  no clubbing, cyanosis, edema, or deformity noted Neurologic:  CN II-XII grossly intact with normal reflexes, coordination, muscle strength and tone Skin:  intact without lesions or rashes Cervical Nodes:  no significant adenopathy Psych:  alert and cooperative; normal attention span and concentration anxious.     MISC.  Report  Procedure date:  04/30/2009  Findings:      He has had PFTs today; this is NORMAL and much improved from May 2009 the date of his last PFT. CT Jan 2011, shows stability in mild Pulmonary Upper Lobe infiltrates since July 2010.    Comments:      both independently reviewed  Pulmonary Function Test Date: 04/30/2009 Height (in.): 73 Gender: Male  Pre-Spirometry FVC    Value: 4.95 L/min   Pred: 5.81 L/min     % Pred: 85 % FEV1    Value: 4.19 L     Pred: 4.50 L     % Pred: 93 % FEV1/FVC  Value: 85 %     Pred: 77 %     % Pred: 0 % FEF 25-75  Value: 4.75 L/min   Pred: 4.59 L/min     % Pred: 103 %  Lung Volumes TLC    Value: 6.73 L   % Pred: 86 % RV    Value: 1.78 L   % Pred: 86 % DLCO    Value: 29.5 %   % Pred: 90 % DLCO/VA  Value: 29.5 %   % Pred: 90 %  Comments: much improved in FVC, Fev1, TLC and DLCO cmoparted to MAy 2009  Evaluation: normal  Impression & Recommendations:  Problem # 1:  ENCOUNTER FOR THERAPEUTIC DRUG MONITORING (ICD-V58.83) Assessment Unchanged Checked LFT, BMET, CBCD due to methotrexate intake. Alll normal. Will communicate this with him HgbA1c is normal. No diabetes since being off steroids. No need for him to take any oral diabetic agents Orders: TLB-Hepatic/Liver Function Pnl (80076-HEPATIC) TLB-CBC Platelet - w/Differential (85025-CBCD) TLB-BMP (Basic Metabolic Panel-BMET) (80048-METABOL) TLB-A1C / Hgb A1C (Glycohemoglobin) (83036-A1C) Est. Patient Level IV (88416)  Problem # 2:  TOBACCO ABUSE (ICD-305.1) Assessment: Unchanged  Still smoking. Wants to quit. Does not want chantix. He is wiling to try wellbutrin. Explained mechanism of action. He has taken scripot for it   3 minutes counselling The following medications were removed from the medication list:    Chantix Continuing Month Pak 1 Mg Tabs (Varenicline tartrate) .Marland Kitchen... Take as directed  Orders: Est. Patient Level IV (60630) Tobacco use cessation intermediate 3-10 minutes  (16010)  Problem # 3:  SARCOIDOSIS (ICD-135) Assessment: Unchanged Stage 2 Pulmonary sarcoid. Asymptomatic. PFTs are normal. He has finished nearly 22 month Rx with methotrxat and cumulative >1 year treatment wtih steroids. HE does  not tolerate steroids. Given asymptomatic state, normal PFTs and even though only has mild stage 2 abnormalities on CT chest I have recommended stopping methotrexate and be treatment free and monitoring closely  plan do pfts at followup return 6-9 months return sooner if worse  Medications Added to Medication List This Visit: 1)  Wellbutrin 100 Mg Tabs (Bupropion hcl) .... Take 1 tablet daily 2)  Wellbutrin 100 Mg Tabs (Bupropion hcl) .... Take 1 tablet three times daily  Patient Instructions: 1)  stop methotrexate and folic acid - you have finished 22 month Rx 2)  your stage 2 sarcoid is stable and under control 3)  have blood test today 4)  return to see me in 6-9 months with breathing test 5)  quit smoking 6)  I have given you wellbutrin prescripotion for it 7)  > take 1  a day for 1 week 8)  > then quit smoking 9)  > then 3 times daily  10)  if you have any new problems, call us and come sooner Prescriptions: WELLBUTRIN 100 MG TABS (BUPROPION HCL) take 1 tablet three times daily  #90 x 3   Entered and Authorized by:   Kalman Shan MD   Signed by:   Kalman Shan MD on 04/30/2009   Method used:   Electronically to        [facility]. [phone]* (retail)       [address]       [place], [place]  [address]       Ph: [phone]       Fax: [phone]   RxID:   [id number] WELLBUTRIN 100 MG TABS (BUPROPION HCL) take 1 tablet daily  #7 x 0   Entered and Authorized by:   Kalman Shan MD   Signed by:   Kalman Shan MD on 04/30/2009   Method used:   Electronically to        [facility]. [phone]* (retail)       [address]       [place], [place]  [address]        Ph: [phone]       Fax: [phone]   RxID:   [id number]
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
## 2010-03-15 NOTE — Progress Notes (Signed)
Summary: [facility] Patient  Phone Note From Other Clinic Call back at [phone]   Caller: Provider/ Dr. Cleta Alberts Call For: Dr. Marchelle Gearing Request: Talk with Provider Summary of Call: Dr. Earl Lites called from [facility] to discuss this patient, he would like for you to call him back today or tomorrow...Marland KitchenMarland KitchenMarland Kitchenhe will be leaving at 5pm today. I offered to page you he stated it was not neccessary, a phone note would be sufficient. (WED 05-04-09) Denna Haggard, CMA  May 05, 2009 4:31 PM Initial call taken by: Denna Haggard, CMA,  May 05, 2009 4:31 PM  Follow-up for Phone Call        I left message with Dr. Cleta Alberts nurse to call MR on his cell phone. MR aware.Carron Curie CMA  May 06, 2009 9:44 AM   Additional Follow-up for Phone Call Additional follow up Details #1::        he can call me on my cell Additional Follow-up by: Kalman Shan MD,  May 06, 2009 10:45 AM    Additional Follow-up for Phone Call Additional follow up Details #2::    spoke to Dr Cleta Alberts. HE sees no evidence of infection . Patient has tendon tenosynovial joiunt issue due to work. An FYI call Follow-up by: Kalman Shan MD,  May 06, 2009 4:46 PM

## 2010-03-15 NOTE — Progress Notes (Signed)
Summary: results  Phone Note Call from Patient Call back at Home Phone [phone]   Caller: Patient Call For: ramaswamy Reason for Call: Talk to Nurse Summary of Call: pt wants results of ct done 03/12/2009. Initial call taken by: Eugene Gavia,  March 26, 2009 3:01 PM  Follow-up for Phone Call        Please advise results of ct chest thanks [name]  March 26, 2009 3:14 PM   Additional Follow-up for Phone Call Additional follow up Details #1::        No change in infiltrates. Nothing new. No worsening. So that is the good news. Please have him do Full PFTs and see me for review Additional Follow-up by: Kalman Shan MD,  March 26, 2009 3:43 PM    Additional Follow-up for Phone Call Additional follow up Details #2::    Spoke with pt and made aware of results/recs per MR.  Pt verbalized understanding. Order sent to [facility].

## 2010-03-15 NOTE — Progress Notes (Signed)
Summary: rx request  Phone Note Call from Patient Call back at Home Phone [phone]   Caller: Patient Call For: ramaswamy Summary of Call: pt request rx for valtrex. did not elaborate and i felt he would be more comfortable speaking to nurse. i asked if he call his pcp but he says that mr had prescribed this for him in the past. [facility] [address]  Initial call taken by: Tivis Ringer, CNA,  December 13, 2009 10:21 AM  Follow-up for Phone Call        advised pt that he would need to contact PCP for this. He states he has but cannot get a response. I advised pt to call pharmacy and have them fax refill request. He states he did this this AM. I advised it does take time to have refill completed but I will send message to primary care to have them address. Carron Curie CMA  December 13, 2009 11:42 AM     Per EMR, pt last had this in 07/10/2008 Valtrex 1mg   Take 1 tablet by mouth once a day x5days. Please advise if ok to send/ same sig/refills Thanks.Marland KitchenMarland KitchenAlvy Beal Archie CMA  December 13, 2009 11:55 AM   Additional Follow-up for Phone Call Additional follow up Details #1::        yes Additional Follow-up by: Etta Grandchild MD,  December 13, 2009 12:02 PM    Additional Follow-up for Phone Call Additional follow up Details #2::    lmovm to call back. need to know rx sent .Marland KitchenAlvy Beal Archie CMA  December 13, 2009 5:14 PM   Patient notified.Marland KitchenMarland KitchenAlvy Beal Archie CMA  December 14, 2009 8:50 AM   New/Updated Medications: VALTREX 1 GM TABS (VALACYCLOVIR HCL) Take 1 tablet by mouth once a day x 5 days Prescriptions: VALTREX 1 GM TABS (VALACYCLOVIR HCL) Take 1 tablet by mouth once a day x 5 days  #30 x 1   Entered by:   Rock Nephew CMA   Authorized by:   Etta Grandchild MD   Signed by:   Rock Nephew CMA on 12/13/2009   Method used:   Faxed to ...       [facility] [address] [phone]* (retail)       [address]       [place], [place]  [address]       Ph:  [phone]       Fax: [phone]   RxID:   [id number]

## 2010-03-17 NOTE — Progress Notes (Signed)
Summary: needs appt with rheum Dr. Dareen Piano for [facility] for sarcoid  Phone Note Outgoing Call   Summary of Call: had PFts 01/05/2010. These are normal BUT FVC is down 330cc/6,4%, Fev1 is down 250cc/6% and TLC is down 1.24L/18.5%. Absoulte values are FVC 4.63/80%, FEv1 3.94L/88% and TLC 5.46/70%. DLCO is high. OVerall, tests fit in with sarcoid progression. I called him to give results and discuss.  Home phone would not answer. Tried work but lady said he was not there.  Gave him results. He is againts prednisone. wants to meet with Dr. Azzie Roup Rheum about INFLIXIMAB Rx. Will have [employer]. I wil talk to Dr. Vincenza Hews Personally too once appt date is confirmed Initial call taken by: Kalman Shan MD,  January 19, 2010 1:16 PM  Follow-up for Phone Call        order palced and sent to [facility]. I asked them to let me know when the appt is so I can notify MR. Carron Curie CMA  January 21, 2010 12:03 PM   Pt has appt with Dr. Dareen Piano on 02-03-10. Carron Curie CMA  January 25, 2010 3:12 PM   Additional Follow-up for Phone Call Additional follow up Details #1::        ok great. Thanks. WE will look forward to letter from Dr Dareen Piano Additional Follow-up by: Kalman Shan MD,  January 25, 2010 5:39 PM

## 2010-05-12 ENCOUNTER — Telehealth: Payer: Self-pay | Admitting: Internal Medicine

## 2010-05-12 DIAGNOSIS — D869 Sarcoidosis, unspecified: Secondary | ICD-10-CM

## 2010-05-12 NOTE — Telephone Encounter (Signed)
[name] [name], He was supposed to see Dr Dareen Piano for Rx about sarcoid. Never heard anything. Pls call and find out.

## 2010-05-18 NOTE — Telephone Encounter (Signed)
[email or web address].Jennifer Castillo, CMA  

## 2010-05-19 NOTE — Telephone Encounter (Signed)
Spoke with pt and he states e never went to appointment with Dr. Dareen Piano. He states he was told by their office and they would mail him directions and they never did so he did not go to apponitment or follow-up with them.  Do you want me to place another order for this or does pt need an appointment here first? Please advise. Carron Curie, CMA

## 2010-05-19 NOTE — Telephone Encounter (Signed)
He needs to see Dr. Dareen Piano. I dont know what happened. He has good insurance. Please call their office and find out and make sure they give a time and date to him. We need to know that time anddate

## 2010-05-21 LAB — BASIC METABOLIC PANEL
Calcium: 8.3 mg/dL — ABNORMAL LOW (ref 8.4–10.5)
GFR calc Af Amer: >60 mL/min (ref >60)
GFR calc non Af Amer: >60 mL/min (ref >60)
Potassium: 3.1 mEq/L — ABNORMAL LOW (ref 3.5–5.1)
Sodium: 133 mEq/L — ABNORMAL LOW (ref 135–145)

## 2010-05-21 LAB — GLUCOSE, CAPILLARY
Glucose-Capillary: 154 mg/dL — ABNORMAL HIGH (ref 70–99)
Glucose-Capillary: 165 mg/dL — ABNORMAL HIGH (ref 70–99)
Glucose-Capillary: 185 mg/dL — ABNORMAL HIGH (ref 70–99)
Glucose-Capillary: 187 mg/dL — ABNORMAL HIGH (ref 70–99)
Glucose-Capillary: 288 mg/dL — ABNORMAL HIGH (ref 70–99)

## 2010-05-26 NOTE — Telephone Encounter (Signed)
I sent an order to [facility] to have pt referred back to Dr. Dareen Piano office.

## 2010-06-28 NOTE — Assessment & Plan Note (Signed)
[facility]                             PULMONARY OFFICE NOTE   NAME:Dillehay, Stella                    MRN:          [id number]  DATE:11/22/2006                            DOB:          08/24/1980    CHIEF COMPLAINT:  Evaluation for sarcoidosis.   [name] [name] is a pleasant 30 year old African-American male who  presents for evaluation of possible pulmonary sarcoidosis.  History  dates back to 2 months ago when he developed bilateral pink eye.  He  said at first he was not worried about this, but due to presence of red  eye and also vision changes he presented to an ophthalmologist who told  him that he had iritis.  Apparently, the ophthalmologist felt this was  from a systemic cause and then referred him to Dr. Artist Pais in primary care.  During the workup with Dr. Artist Pais, around 2-1/2 weeks ago, patient states  that he also suffered left-sided Bell's palsy which the ophthalmologist  said could also be from sarcoidosis.  The Bell's palsy is largely  resolved now and, in any event, he underwent a chest x-ray which was  abnormal and subsequently underwent a CT scan of the chest.  Based on  the radiologic findings sarcoidosis was considered highly likely and he  has been referred here.  In the interim, he underwent HIV testing  (results not available) and PPD testing on November 05, 2006, which was  negative.   Overall, he feels fine, but in detailed questioning he admits that he  has had weight loss for the last 2 to 3 months, eye problems for the  last 2 to 3 months, headaches for the last 2 to 3 months, diffuse  arthralgia for the last 2 to 3 months, nausea for the last 1 month, and  he stopped working, because of his symptoms, 1 month ago and for the  last 2 to 3 weeks he even has a dry cough.   He denies any fever, rash, hemoptysis, edema, shortness of breath,  paroxysmal nocturnal dyspnea or orthopnea.   PAST MEDICAL HISTORY:  Essentially  healthy male until the onset of  current problems.  No known past medical problems.  Never been exposed  to anybody with tuberculosis.   ALLERGIES:  No known drug allergies.   SOCIAL HISTORY:  1. He lives with his girlfriend.  2. He works as a Nature conservation officer loading trucks.  3. He has not had any travel in recent months.  4. He has two children, 8 and 13.  5. He smokes for the last 9 years one cigarette per day.   His chart says he is on Chantix.  He is also on multivitamins.   FAMILY HISTORY:  1. Denies any sarcoid, asthma or COPD in the family.  2. Maternal grandmother had cirrhosis of the liver from a lot of      alcohol abuse.   REVIEW OF SYSTEMS:  This is documented in the History of Present Illness  and on the questionnaire significant eye problems, headaches, arthralgia  and nausea, weight loss for the last 2 to 3 months and then  dry cough  for the last 2 to 3 weeks.  In specific, his headache he feels is  secondary to his eye problem and it is reduced with his eye drops and  is associated with nausea and it is frontal in location.   PHYSICAL EXAM:  Weight 192.8 pounds, temperature 98, blood pressure  110/78, pulse of 95, saturation 98% on room air.  EXAM FINDINGS:  A well-built, extremely muscular male seated  comfortably.  CENTRAL NERVOUS SYSTEM:  Alert and oriented x3.  Speech and ambulation  are normal.  His left eye blink rate seems slightly diminished, but  otherwise no obvious deformities in the cranial nerve exams.  Motor and  sensory exams are normal.  HEENT EXAM:  No neck nodes, no elevated JVP.  RESPIRATORY EXAM:  Air entry equal on both sides.  Clear to  auscultation.  CARDIOVASCULAR:  Normal heart sounds, no gallops, no murmurs.  ABDOMEN:  Soft, no organomegaly.  EXTREMITIES:  No cyanosis, no clubbing, no edema, no erythema.  SKIN EXAM:  No erythema nodosum, no rash.  BACK EXAM:  Normal.  JOINT EXAM:  Normal.   LABORATORY EVALUATION:  September 19th, ESR was  elevated at 25.  February 15, 2006, white count 5.9, hemoglobin 15, platelets 264,000.  Chemistries:  February 15, 2006,  creatinine 1.3, bicarbonate 31.  Otherwise, all essentially normal.  February 15, 2006, TSH normal 2.37,  calcium 10.1, total protein 8.5.   Chest x-ray November 02, 2006, shows bilateral hilar lymphadenopathy.  CT scan of the chest November 13, 2006, shows mediastinal  lymphadenopathy and early airway nodularity and parenchymal nodularity  in the right upper lobe posterior segment.  This was confirmed by  Radiology as well.   ASSESSMENT AND PLAN:  This is a 29 year old African-American male  presenting with uveitis 2 to 3 months ago but also has headache for the  last 2 to 3 months along with arthralgia and nausea for the last 1  month.  The last 2 to 3 weeks he has a dry cough and he also has a 10  pound weight loss in the last 2 to 3 months.  In summary, he has a lot  of systemic symptoms.   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  Possibly exists for neurosarcoidosis as well,  given his headache and nausea.   PLAN:  1. Repeat PPD booster testing.  2. Full pulmonary function test.  3. Six minute walk test.  4. Complete blood work.  5. MRI of the brain with and without contrast to look for      neurosarcoidosis.  6. Obtain ANA and ANCA to rule out autoimmune diseases.  7. Diagnostic testing.  We discussed bronchoscopy versus      mediastinoscopy.  He understands mediastinoscopy is slightly more      risky but wants to have one procedure with 100% certainty of      diagnosis and, therefore, opted for mediastinoscopy.  I referred      him to Dr. Karle Plumber, plastic surgeon, for mediastinoscopy.  8. Health maintenance.  Offered flu shot but he declined.  9. At next visit, I will check his HIV status   I spent some time with him  explaining sarcoidosis and the potential need  for treatment with prednisone and methotrexate, he is aware of this. I  will see him after he finishes all of these tests.  Kalman Shan, MD  Electronically Signed    MR/MedQ  DD: 11/22/2006  DT: 11/23/2006  Job #: [id number]   cc:   Barbette Hair. Artist Pais, DO

## 2010-06-28 NOTE — Discharge Summary (Signed)
[name] [name], [name] [name]           ACCOUNT NO.:  [id number]   MEDICAL RECORD NO.:  [id number]          PATIENT TYPE:  INP   LOCATION:  1336                         FACILITY:  [facility]   PHYSICIAN:  Stacie Glaze, MD    DATE OF BIRTH:  1980-06-24   DATE OF ADMISSION:  10/02/2008  DATE OF DISCHARGE:  10/04/2008                               DISCHARGE SUMMARY   ADMISSION DIAGNOSES:  1. Hyperglycemia.  2. New-onset diabetes.  3. Hyperosmolar dehydration, ICD9 250.20.   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  Admitted to the primary care service, begun  on sliding scale insulin, IV hydration, and the steroid dose that he was  taking for a sarcoidosis was stopped.  He remained stable from a  respiratory standpoint after the steroids were stopped (he was on a  taper already), and his sugars rapidly declined from being in the 400s  to being in the mid 100s with a CBG this morning of 163.   He has been given a basilar insulin dose of Lantus 10 units but with  cessation of the steroids, we are hopeful that he will no longer require  any insulin injections, as this is his preference.   He is to continue metformin 500 mg p.o. b.i.d.  He is to stay hydrated.  He is to be on a no-concentrated sweets diabetic diet and was urged to  avoid all sources of concentrated sweets such as soft drinks, beer,  wine, and any dessert items.   He was taught how to use the Glucometer.  He will check his fingersticks  every morning.  Should his glucoses begin to rise, he will call his  primary care physician, Dr. Santa Genera, to initiate Lantus basilar  insulin therapy.  However, we are hopeful with dietary guidance and  monitoring, that he can avoid insulin at this time since it is our  belief that this was a steroid-induced diabetes.   DISCHARGE DIAGNOSES:  1. Steroid-induced  hyperglycemia.  2. Hyperglycemia with hyperosmolar state.  3. Dehydration.   DISCHARGE CONDITION:  Stable.   DISCHARGE MEDICATIONS:  Resume home medications including metformin 500  mg p.o. b.i.d.  A Glucometer prescription was given to the patient.     Stacie Glaze, MD  Electronically Signed    JEJ/MEDQ  D:  10/04/2008  T:  10/05/2008  Job:  [id number]

## 2010-06-28 NOTE — Letter (Signed)
January 09, 2007   Kalman Shan, MD  [address]  [address]  [place], [place] [address]   Re:  [name] [name], [name] [name]                   DOB:  1980/05/31   Dear Carmin Muskrat,   I saw Mr. Crear back after his biopsy and it showed a granulomatous  lymphadenitis consistent with sarcoid.  We did see some slight  abnormalities in his tracheobronchial tree but that was just a bronchial  tissue, so it seems that his sarcoid is confined to his lymph nodes.  His mediastinotomy incision was well healed.  I plan to see him back  again in 3 weeks.  His blood pressure 132/75, pulse 86, respirations 18,  sats 99%.   Ines Bloomer, M.D.  Electronically Signed   DPB/MEDQ  D:  01/09/2007  T:  01/09/2007  Job:  [id number]

## 2010-06-28 NOTE — Op Note (Signed)
NAME:  [name] [name], [name] [name]           ACCOUNT NO.:  [id number]   MEDICAL RECORD NO.:  [id number]          PATIENT TYPE:  AMB   LOCATION:  SDS                          FACILITY:  [facility]   PHYSICIAN:  Ines Bloomer, M.D. DATE OF BIRTH:  August 09, 1980   DATE OF PROCEDURE:  01/04/2007  DATE OF DISCHARGE:                               OPERATIVE REPORT   PREOPERATIVE DIAGNOSIS:  Mediastinal adenopathy.   POSTOPERATIVE DIAGNOSIS:  Mediastinal adenopathy.   OPERATION PERFORMED:  1. Fiberoptic bronchoscopy.  2. Mediastinoscopy.   After general anesthesia, the video bronchoscope passed through the  endotracheal tube.  The carina was in the midline.  The left mainstem,  left upper lobe and left lower lobe orifices were normal.  On the right  side the right upper lobe orifices were normal but in the bronchus  intermedius and the takeoff of the right middle lobe there were two  small nodules, which were biopsied.  They might be indicative of  sarcoidosis.  Washings were taken and cultures were taken.  The video  bronchoscope was removed.  The patient's anterior neck was prepped and  draped in the usual sterile manner.  A transverse incision was made  above the sternal notch.  Dissection carried down through the  subcutaneous tissue to the pretracheal fascia.  Digital exploration was  carried out.  There was a marked amount of inflammatory reaction and we  were able to insert the scope and carefully dissect out a 2R node, which  was biopsied.  There were some other smaller nodes but they were stuck  to the intense inflammation, so we decided just to stop with the 2R  node.  Strap muscles closed with 2-0 Vicryl subcutaneous tissue and 3-0  Vicryl and Dermabond for the skin.      Ines Bloomer, M.D.  Electronically Signed     DPB/MEDQ  D:  01/04/2007  T:  01/04/2007  Job:  [id number]

## 2010-06-28 NOTE — Letter (Signed)
December 13, 2006   Kalman Shan, MD  [address]  [place] [address]   Re:  [name], [name]                   DOB:  [birth year]   Dear Carmin Muskrat:   I appreciate seeing [name] [name].  This 30 year old African-  American male has started having an iritis and some blurring of vision.  He was seen by his ophthalmologist who thought that he might have  sarcoidosis and was referred to Dr. Cathie Hoops whose workup revealed that he had  a left-sided Bell's palsy also which is now resolved.  A chest x-ray and  CT scan showed mediastinal hilar adenopathy.  Apparently PPP was  negative and PPD was negative.  He has had some weight loss for 2-3  months, diffuse arthralgias, some questionable headaches and some  nausea.  He has had no fever, rash, hemoptysis, excessive sputum,  shortness of breath.   ALLERGIES:  NONE.   MEDICATIONS:  None.   PAST SURGICAL HISTORY:  None.   SOCIAL HISTORY:  He works unloading trucks.  He has 2 children, 6 and  13.  He has been smoking for the last 9 years and is apparently on  Chantix now.   FAMILY HISTORY:  Positive for pulmonary disease and for alcoholic  cirrhosis.   REVIEW OF SYSTEMS:  CARDIAC:  No angina or atrial fibrillation.  PULMONARY:  He has got a cough.  No hemoptysis.  GI:  Constipation.  GU:  Frequent urination.  VASCULAR:  No claudication, DVT, TIA.  NEUROLOGIC:  No headaches, blackout, seizures, arthritis.  No muscle pain.  No  psychiatric illness.  HEENT:  Eyes:  See history of present illness.  No  changes in his hearing.  HEMATOLOGICAL:  No problems with bleeding or  clotting disorders.   PHYSICAL EXAMINATION:  GENERAL:  He is a well-developed African-American  male in no acute distress.  HEAD/EYES/EARS/NOSE/THROAT:  Unremarkable.  NECK:  Supple without thyromegaly.  There is no supraclavicular or axial  adenopathy.  CHEST:  Clear to auscultation and percussion.  HEART:  Regular sinus rhythm.  No murmurs.  ABDOMEN:  Soft.  There is no hepatosplenomegaly.  EXTREMITIES:  Pulses are 2+.  There is no clubbing or edema.  NEUROLOGIC:  Intact.   ASSESSMENT:  I feel that he probably does have sarcoid.  I have  scheduled him to have a bronchoscopy and medianoscopy on January 04, 2007 at [facility].  I will let you know the results.   I appreciate the opportunity of seeing [name] [name].   Sincerely,   Ines Bloomer, M.D.  Electronically Signed   DPB/MEDQ  D:  12/13/2006  T:  12/14/2006  Job:  [id number]

## 2010-06-28 NOTE — Assessment & Plan Note (Signed)
OFFICE VISIT   [name] [name], [name] [name]  DOB:  1980/06/23                                        February 20, 2007  CHART #:  [id number]   The patient came back for follow up of his mediastinoscopy.  His  incision was healing well.  He was having some itching there.  We gave  him a prescription for Kenalog cream.  His blood pressure was 138/77.  Pulse 88.  Respirations 18.  Saturations were 98%.  I will see him back  again if he has any future problem.   Ines Bloomer, M.D.  Electronically Signed   DPB/MEDQ  D:  02/20/2007  T:  02/20/2007  Job:  [id number]

## 2010-06-28 NOTE — Assessment & Plan Note (Signed)
[facility]                             PULMONARY OFFICE NOTE   NAME:[name], [name]                    MRN:          [id number]  DATE:12/07/2006                            DOB:          1980/07/22    CHIEF COMPLAINT:  Follow up of test results. He is being evaluated for  pulmonary sarcoidosis.   [name] [name] returns for follow up. Last visit was on November 22, 2006. He is here today for follow up of test results. He is still  awaiting his appointment with Dr. Karle Plumber. This has been  scheduled for December 12, 2006, at which time, he is going to get  evaluated for mediastinal biopsy of mediastinal lymph nodes. In the  interim, symptoms of Bell's palsy, he says, have completely resolved.  However, for the past one week, he has symptoms of sinusitis with some  runny nose, cough, increased tiredness, and yellow nasal congestion.  However, he feels that he is getting better, but he is still awakened at  night with copious nasal discharge. He denies fever, headache, or eye  problems.   PAST MEDICAL HISTORY:  Reviewed and unchanged.   CURRENT MEDICATIONS:  1. Eye drops.  2. Multivitamins.  3. Chantix for the past 3 weeks for smoking cessation. Total duration      of treatment apparently is for one year.   SOCIAL HISTORY:  He quit smoking two weeks. He is currently on Chantix  and is being managed by Dr. Artist Pais in primary care.   PHYSICAL EXAMINATION:  VITAL SIGNS:  Blood pressure 110/78, pulse 78,  saturation 99% on room air.  GENERAL:  Exam is essentially unchanged. He is an extremely well built  male seated comfortably.  RESPIRATORY:  Air entry equal. No wheeze. No crackles.  NECK:  Supple. No neck nodes.  CARDIOVASCULAR:  Normal heart sounds.  ABDOMEN:  Soft and nontender.  EXTREMITIES:  No cyanosis or clubbing.  SKIN:  Intact.   LABORATORY DATA:  1. Reviewed six minute walk test. He performed this on December 07, 2006. He  completed 492 meters in 6 minutes. At rest, his blood      pressure was 118/82, heart rate 78, dyspnea scale of 3, fatigue      scale of 8, saturation 99%. At the end of six minutes, his blood      pressure remained at 120/84, pulse 85, dyspnea scale increased to      5, fatigue scale increased to 10, and the saturation was 100% on      room air.   1. CMA evaluation: LFT was normal. Total bilirubin 0.7, alkaline      phosphatase 55, AST 41, total protein 8.7, albumin 4.6 on November 22, 2006.   1. Complete blood count on November 22, 2006 was normal with a white      count of 5.7, hemoglobin 14.3, platelet 182000.   1. Chemistry. Calcium 9.9.   1. Immunologic profile. Angiotensin converting enzyme was normal at 43      units per liter, rheumatoid factor  was negative, ANA was negative      on November 22, 2006, however ESR was elevated at 25 on November 02, 2006.   1. Chemistries were normal with a sodium of 139, potassium 3.7,      chloride 101, bicarbonate 30, BUN 7, creatinine 1.3, and a glucose      of 95.   ASSESSMENT AND PLAN:  1. 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 A six minute walk test did      not show any desaturation suggesting that the severity of      parenchymal involvement of the lung is mild. We are still awaiting      mediastinal lymph node biopsy by Dr. Karle Plumber. Mr. Acree      will return to the clinic after this mediastinal biopsy. Based on      the results of the mediastinal biopsy, we will decide about further      treatment. I am glad to know many of his symptoms have resolved. At      return follow up, I will get HIV testing.   1. Acute sinusitis. This is improving. I recommended that he just take      some over-the-counter Afrin nasal spray for a few days. If the      symptoms are getting worse, I have given him a prescription for      doxycycline to take.   1. Smoking. I have congratulated him on quitting smoking and this is       being followed by Dr. Artist Pais.     Kalman Shan, MD  Electronically Signed    MR/MedQ  DD: 12/10/2006  DT: 12/10/2006  Job #: [id number]

## 2010-07-01 NOTE — Assessment & Plan Note (Signed)
[facility]                             PRIMARY CARE OFFICE NOTE   NAME:[name], [name]                    MRN:          [id number]  DATE:12/08/2005                            DOB:          [birth year]    CHIEF COMPLAINT:  New patient to practice.   HISTORY OF PRESENT ILLNESS:  The patient is a 30 year old African American  male here to establish primary care.  The patient has not had a primary care  physician in the past, denies any major illnesses or hospitalizations.  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  The  patient states that he has not had any outbreak since that time but does  have a prescription for Valtrex to be taken at first sign of infection or  outbreak.   The patient's main reason for visit today is tobacco abuse.  He has been  smoking for approximately eight years.  He averages approximately half pack  per day.  He has tried to quit in the past.  Has used patches but without  success.   CURRENT MEDICATIONS:  Currently none.   ALLERGIES:  None known.   SOCIAL HISTORY:  The patient is single.  Currently lives with his fiance.  He does have a child that is 65 years old from his previous partner that  currently lives with his mother.  His occupation is a Company secretary.   FAMILY HISTORY:  Mother is alive at age 60.  Has a history of hypertension  and had a nervous breakdown in the past.  Father also in his late 48s is  healthy.  There is a history of liver cancer in a grandmother who was  alcoholic.  He has four siblings.  A sister has some manner of calcium  disorder/disease.   HABITS:  He is a social drinker.  Denies any recreational drug use. Tobacco  use as noted above.   REVIEW OF SYSTEMS:  No fevers, chills.  No HEENT symptoms.  The patient  denies any chest pain or shortness of breath.  Occasional heartburn with  spicy foods.  No nausea or vomiting, constipation or diarrhea.   All other  systems negative.  He notes that he has had multiple STDs in the past.   PHYSICAL EXAMINATION:  VITAL SIGNS:  Height 6 feet, weight 206 pounds,  temperature 97.8, pulse 75, BP 136/73 in the left arm, seated position.  GENERAL:  The patient is a very pleasant 30 year old, well-developed, well-  nourished African American male in no apparent distress.  HEENT:  Normocephalic, atraumatic.  Pupils were equal and react to light  bilaterally.  Extraocular muscles intact.  The patient was anicteric.  Conjunctivae within normal limits.  External auditory canals and tympanic  membranes clear bilaterally.  Oropharyngeal exam unremarkable.  NECK:  Supple. No adenopathy, thyromegaly or carotid bruits.  Neck was  somewhat thick.  CHEST:  Normal respiratory effort.  Chest is clear to auscultation  bilaterally.  No rhonchi, rales or wheezing.  CARDIOVASCULAR:  Regular rate and rhythm.  No significant murmurs, rubs or  gallops appreciated.  ABDOMEN:  Soft, nontender.  Positive bowel sounds.  No organomegaly.  MUSCULOSKELETAL:  No clubbing, cyanosis or edema.  GENITAL:  Deferred.  SKIN:  Warm and dry.  NEUROLOGIC:  Cranial nerves II-XII grossly intact, nonfocal.  Left   IMPRESSION/RECOMMENDATIONS:  1. Tobacco abuse.  2. Possible genital herpes.  3. Health maintenance.   RECOMMENDATIONS:  The patient was going to be started on Chantix.  We  discussed the mechanism of action and the patient will follow up in  approximately six weeks.  He is to report any adverse effects.   He was given a prescription for Valtrex 1000 mg to be taken for five days at  first sign of outbreak.  We will try to obtain records from urgent care to  see if a viral culture was obtained.   Lastly, with family history of type 2 diabetes, the patient will be sent for  screening labs including fasting sugar, lipid panel, thyroid functions, and  HIV __________ will be also obtained.     Barbette Hair. Artist Pais, DO     RDY/MedQ  DD: 12/08/2005  DT: 12/10/2005  Job #: [id number]

## 2010-11-22 LAB — FUNGUS CULTURE W SMEAR: Fungal Smear: NONE SEEN

## 2010-11-22 LAB — CBC
MCV: 79.9
Platelets: 295
RBC: 5.19
WBC: 5.3

## 2010-11-22 LAB — AFB CULTURE WITH SMEAR (NOT AT ARMC): Acid Fast Smear: NONE SEEN

## 2010-11-22 LAB — CULTURE, RESPIRATORY W GRAM STAIN
Culture: NO GROWTH
Gram Stain: NONE SEEN

## 2010-11-22 LAB — COMPREHENSIVE METABOLIC PANEL
ALT: 33
AST: 29
Albumin: 4.1
CO2: 27
Chloride: 101
Creatinine, Ser: 1.26
GFR calc Af Amer: >60
Potassium: 4.2
Sodium: 136
Total Bilirubin: 0.8

## 2010-11-22 LAB — TYPE AND SCREEN

## 2010-11-22 LAB — APTT: aPTT: 30

## 2010-11-22 LAB — ABO/RH: ABO/RH(D): A POS

## 2010-11-24 LAB — BLOOD GAS, ARTERIAL
Bicarbonate: 23.2
Drawn by: 211791
FIO2: 0.21
TCO2: 20.3
pCO2 arterial: 37.6
pCO2 arterial: 39.5
pH, Arterial: 7.408
pH, Arterial: 7.426

## 2011-01-04 ENCOUNTER — Ambulatory Visit (INDEPENDENT_AMBULATORY_CARE_PROVIDER_SITE_OTHER): Payer: Managed Care, Other (non HMO) | Admitting: Internal Medicine

## 2011-01-04 ENCOUNTER — Encounter: Payer: Self-pay | Admitting: Internal Medicine

## 2011-01-04 ENCOUNTER — Other Ambulatory Visit: Payer: Self-pay | Admitting: Internal Medicine

## 2011-01-04 ENCOUNTER — Ambulatory Visit (INDEPENDENT_AMBULATORY_CARE_PROVIDER_SITE_OTHER)
Admission: RE | Admit: 2011-01-04 | Discharge: 2011-01-04 | Disposition: A | Discharge status: Home / Self Care | Payer: Managed Care, Other (non HMO) | Source: Ambulatory Visit | Attending: Internal Medicine | Admitting: Internal Medicine

## 2011-01-04 ENCOUNTER — Other Ambulatory Visit (INDEPENDENT_AMBULATORY_CARE_PROVIDER_SITE_OTHER): Payer: Managed Care, Other (non HMO)

## 2011-01-04 VITALS — BP 104/64 | HR 73 | Temp 98.3°F | Resp 16 | Ht 72.0 in | Wt 193.5 lb

## 2011-01-04 DIAGNOSIS — E1010 Type 1 diabetes mellitus with ketoacidosis without coma: Secondary | ICD-10-CM

## 2011-01-04 DIAGNOSIS — D869 Sarcoidosis, unspecified: Secondary | ICD-10-CM

## 2011-01-04 DIAGNOSIS — R05 Cough: Secondary | ICD-10-CM

## 2011-01-04 DIAGNOSIS — J209 Acute bronchitis, unspecified: Secondary | ICD-10-CM

## 2011-01-04 DIAGNOSIS — Z23 Encounter for immunization: Secondary | ICD-10-CM

## 2011-01-04 LAB — COMPREHENSIVE METABOLIC PANEL
ALT: 30 U/L (ref 0–53)
AST: 26 U/L (ref 0–37)
Albumin: 4.7 g/dL (ref 3.5–5.2)
CO2: 28 mEq/L (ref 19–32)
Calcium: 9.3 mg/dL (ref 8.4–10.5)
Chloride: 103 mEq/L (ref 96–112)
GFR: 85.75 mL/min (ref >60.00)
Potassium: 4 mEq/L (ref 3.5–5.1)

## 2011-01-04 LAB — URINALYSIS, ROUTINE W REFLEX MICROSCOPIC
Bilirubin Urine: NEGATIVE
Hgb urine dipstick: NEGATIVE
Ketones, ur: NEGATIVE
Total Protein, Urine: NEGATIVE
Urine Glucose: NEGATIVE
Urobilinogen, UA: 0.2 (ref 0.0–1.0)

## 2011-01-04 LAB — CBC WITH DIFFERENTIAL/PLATELET
Eosinophils Absolute: 0.1 10*3/uL (ref 0.0–0.7)
Eosinophils Relative: 1.5 % (ref 0.0–5.0)
HCT: 44.8 % (ref 39.0–52.0)
Lymphs Abs: 1.5 10*3/uL (ref 0.7–4.0)
MCHC: 34.3 g/dL (ref 30.0–36.0)
MCV: 82.9 fl (ref 78.0–100.0)
Monocytes Absolute: 0.6 10*3/uL (ref 0.1–1.0)
Neutrophils Relative %: 57.2 % (ref 43.0–77.0)
Platelets: 253 10*3/uL (ref 150.0–400.0)
WBC: 5.3 10*3/uL (ref 4.5–10.5)

## 2011-01-04 LAB — LIPID PANEL
Cholesterol: 258 mg/dL — ABNORMAL HIGH (ref 0–200)
Total CHOL/HDL Ratio: 7
Triglycerides: 321 mg/dL — ABNORMAL HIGH (ref 0.0–149.0)

## 2011-01-04 LAB — LDL CHOLESTEROL, DIRECT: Direct LDL: 206.4 mg/dL

## 2011-01-04 LAB — TSH: TSH: 2.58 u[IU]/mL (ref 0.35–5.50)

## 2011-01-04 MED ORDER — CEFUROXIME AXETIL 500 MG PO TABS: 500.0000 mg | ORAL_TABLET | Freq: Two times a day (BID) | ORAL | Status: AC

## 2011-01-04 MED ORDER — HYDROCOD POLST-CPM POLST ER 10-8 MG PO CP12: 1.0000 | ORAL_CAPSULE | Freq: Two times a day (BID) | ORAL | Status: DC | PRN

## 2011-01-04 NOTE — Patient Instructions (Signed)

## 2011-01-08 ENCOUNTER — Encounter: Payer: Self-pay | Admitting: Internal Medicine

## 2011-01-08 NOTE — Assessment & Plan Note (Signed)
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

## 2011-01-08 NOTE — Progress Notes (Signed)
Subjective:    Patient ID: [name] [name], male    DOB: 1980/04/23, 30 y.o.   MRN: [id number]  Cough This is a new problem. The current episode started in the past 7 days. The problem has been gradually worsening. The problem occurs every few hours. The cough is productive of purulent sputum. Associated symptoms include chills. Pertinent negatives include no chest pain, ear congestion, ear pain, fever, headaches, heartburn, hemoptysis, myalgias, nasal congestion, postnasal drip, rash, rhinorrhea, sore throat, shortness of breath, sweats, weight loss or wheezing. The symptoms are aggravated by nothing. He has tried nothing for the symptoms. The treatment provided no relief.  Diabetes He presents for his follow-up diabetic visit. He has type 1 diabetes mellitus. The initial diagnosis of diabetes was made 2 years ago. His disease course has been stable. There are no hypoglycemic associated symptoms. Pertinent negatives for hypoglycemia include no dizziness, headaches, pallor, seizures, speech difficulty, sweats or tremors. Pertinent negatives for diabetes include no blurred vision, no chest pain, no fatigue, no foot paresthesias, no foot ulcerations, no polydipsia, no polyphagia, no polyuria, no visual change, no weakness and no weight loss. There are no hypoglycemic complications. Symptoms are stable. There are no diabetic complications. When asked about current treatments, none were reported. His weight is stable. He is following a generally healthy diet. Meal planning includes avoidance of concentrated sweets. He participates in exercise three times a week. An ACE inhibitor/angiotensin II receptor blocker is not being taken. He does not see a podiatrist.Eye exam is current.      Review of Systems  Constitutional: Positive for chills. Negative for fever, weight loss, diaphoresis, activity change, appetite change, fatigue and unexpected weight change.  HENT: Negative for ear pain, sore throat, facial  swelling, rhinorrhea, sneezing, trouble swallowing, neck pain, neck stiffness, voice change, postnasal drip and sinus pressure.   Eyes: Negative.  Negative for blurred vision.  Respiratory: Positive for cough. Negative for apnea, hemoptysis, choking, chest tightness, shortness of breath, wheezing and stridor.   Cardiovascular: Negative for chest pain, palpitations and leg swelling.  Gastrointestinal: Negative for heartburn, nausea, vomiting, abdominal pain, diarrhea, constipation, blood in stool, abdominal distention, anal bleeding and rectal pain.  Genitourinary: Negative for dysuria, urgency, polyuria, frequency, hematuria, flank pain, decreased urine volume, enuresis and difficulty urinating.  Musculoskeletal: Negative for myalgias, back pain, joint swelling, arthralgias and gait problem.  Skin: Negative for color change, pallor, rash and wound.  Neurological: Negative for dizziness, tremors, seizures, syncope, facial asymmetry, speech difficulty, weakness, light-headedness, numbness and headaches.  Hematological: Negative for polydipsia, polyphagia and adenopathy. Does not bruise/bleed easily.  Psychiatric/Behavioral: Negative.        Objective:   Physical Exam  Vitals reviewed. Constitutional: He is oriented to person, place, and time. He appears well-developed and well-nourished. No distress.  HENT:  Head: Normocephalic and atraumatic.  Mouth/Throat: Oropharynx is clear and moist. No oropharyngeal exudate.  Eyes: Conjunctivae are normal. Right eye exhibits no discharge. Left eye exhibits no discharge. No scleral icterus.  Neck: Normal range of motion. Neck supple. No JVD present. No tracheal deviation present. No thyromegaly present.  Cardiovascular: Normal rate, regular rhythm, normal heart sounds and intact distal pulses.  Exam reveals no gallop and no friction rub.   No murmur heard. Pulmonary/Chest: Effort normal and breath sounds normal. No stridor. No respiratory distress. He has  no wheezes. He has no rales. He exhibits no tenderness.  Abdominal: Soft. Bowel sounds are normal. He exhibits no distension and no mass. There is no tenderness.  There is no rebound and no guarding.  Musculoskeletal: Normal range of motion. He exhibits no edema and no tenderness.  Lymphadenopathy:    He has no cervical adenopathy.  Neurological: He is oriented to person, place, and time.  Skin: Skin is warm and dry. No rash noted. He is not diaphoretic. No erythema. No pallor.  Psychiatric: He has a normal mood and affect. His behavior is normal. Judgment and thought content normal.          Assessment & Plan:

## 2011-01-08 NOTE — Assessment & Plan Note (Signed)
Start ceftin for the infection and tussicaps for the cough 

## 2011-01-08 NOTE — Assessment & Plan Note (Signed)
I will check a CXR to look for sarcoid involvement, pna, mass, edema, etc

## 2011-01-08 NOTE — Assessment & Plan Note (Signed)
I will check his a1c to see how well his blood sugars have been controlled

## 2011-01-09 ENCOUNTER — Encounter: Payer: Self-pay | Admitting: Internal Medicine

## 2011-05-26 ENCOUNTER — Ambulatory Visit (INDEPENDENT_AMBULATORY_CARE_PROVIDER_SITE_OTHER)
Admission: RE | Admit: 2011-05-26 | Discharge: 2011-05-26 | Disposition: A | Discharge status: Home / Self Care | Payer: Managed Care, Other (non HMO) | Source: Ambulatory Visit | Attending: Internal Medicine | Admitting: Internal Medicine

## 2011-05-26 ENCOUNTER — Ambulatory Visit (INDEPENDENT_AMBULATORY_CARE_PROVIDER_SITE_OTHER): Payer: Managed Care, Other (non HMO) | Admitting: Internal Medicine

## 2011-05-26 ENCOUNTER — Ambulatory Visit: Payer: Managed Care, Other (non HMO) | Admitting: Internal Medicine

## 2011-05-26 ENCOUNTER — Other Ambulatory Visit (INDEPENDENT_AMBULATORY_CARE_PROVIDER_SITE_OTHER): Payer: Managed Care, Other (non HMO)

## 2011-05-26 ENCOUNTER — Encounter: Payer: Self-pay | Admitting: Internal Medicine

## 2011-05-26 VITALS — BP 120/72 | HR 77 | Temp 98.7°F | Ht 72.0 in | Wt 192.1 lb

## 2011-05-26 DIAGNOSIS — Z23 Encounter for immunization: Secondary | ICD-10-CM

## 2011-05-26 DIAGNOSIS — F17200 Nicotine dependence, unspecified, uncomplicated: Secondary | ICD-10-CM

## 2011-05-26 DIAGNOSIS — M25512 Pain in left shoulder: Secondary | ICD-10-CM

## 2011-05-26 DIAGNOSIS — N529 Male erectile dysfunction, unspecified: Secondary | ICD-10-CM

## 2011-05-26 DIAGNOSIS — E785 Hyperlipidemia, unspecified: Secondary | ICD-10-CM | POA: Insufficient documentation

## 2011-05-26 DIAGNOSIS — E1010 Type 1 diabetes mellitus with ketoacidosis without coma: Secondary | ICD-10-CM

## 2011-05-26 DIAGNOSIS — M25519 Pain in unspecified shoulder: Secondary | ICD-10-CM

## 2011-05-26 DIAGNOSIS — E782 Mixed hyperlipidemia: Secondary | ICD-10-CM

## 2011-05-26 DIAGNOSIS — D869 Sarcoidosis, unspecified: Secondary | ICD-10-CM

## 2011-05-26 LAB — COMPREHENSIVE METABOLIC PANEL
ALT: 22 U/L (ref 0–53)
CO2: 30 mEq/L (ref 19–32)
GFR: 85.54 mL/min (ref >60.00)
Potassium: 3.9 mEq/L (ref 3.5–5.1)
Sodium: 138 mEq/L (ref 135–145)
Total Bilirubin: 0.2 mg/dL — ABNORMAL LOW (ref 0.3–1.2)
Total Protein: 7.9 g/dL (ref 6.0–8.3)

## 2011-05-26 LAB — CBC WITH DIFFERENTIAL/PLATELET
Basophils Absolute: 0 10*3/uL (ref 0.0–0.1)
HCT: 44.3 % (ref 39.0–52.0)
Lymphocytes Relative: 16.6 % (ref 12.0–46.0)
Lymphs Abs: 0.9 10*3/uL (ref 0.7–4.0)
Monocytes Relative: 13.2 % — ABNORMAL HIGH (ref 3.0–12.0)
Neutrophils Relative %: 68.6 % (ref 43.0–77.0)
Platelets: 212 10*3/uL (ref 150.0–400.0)
RDW: 13.4 % (ref 11.5–14.6)

## 2011-05-26 LAB — URINALYSIS, ROUTINE W REFLEX MICROSCOPIC
Ketones, ur: NEGATIVE
Specific Gravity, Urine: 1.025 (ref 1.000–1.030)
Urine Glucose: NEGATIVE
pH: 6.5 (ref 5.0–8.0)

## 2011-05-26 LAB — LIPID PANEL
HDL: 39.4 mg/dL (ref >39.00)
Total CHOL/HDL Ratio: 6
Triglycerides: 247 mg/dL — ABNORMAL HIGH (ref 0.0–149.0)

## 2011-05-26 MED ORDER — TADALAFIL 5 MG PO TABS: 5.0000 mg | ORAL_TABLET | Freq: Every day | ORAL | Status: DC | PRN

## 2011-05-26 MED ORDER — NAPROXEN SODIUM ER 750 MG PO TB24: 1.0000 | ORAL_TABLET | Freq: Every day | ORAL | Status: DC

## 2011-05-26 NOTE — Progress Notes (Signed)
Subjective:    Patient ID: [name] [name], male    DOB: 1980/07/15, 31 y.o.   MRN: [id number]  Shoulder Pain  The pain is present in the left shoulder. This is a chronic problem. The current episode started more than 1 year ago. There has been no history of extremity trauma. The problem occurs intermittently. The problem has been gradually worsening. The quality of the pain is described as aching. The pain is at a severity of 2/10. The pain is mild. Pertinent negatives include no fever, joint locking, joint swelling, limited range of motion, numbness, stiffness or tingling. The symptoms are aggravated by activity. He has tried nothing for the symptoms. Family history does not include gout or rheumatoid arthritis.  Erectile Dysfunction This is a recurrent problem. The current episode started more than 1 year ago. The problem has been gradually worsening since onset. The nature of his difficulty is achieving erection, maintaining erection and penetration. Non-physiologic factors contributing to erectile dysfunction are performance anxiety. He reports no anxiety or decreased libido. He reports his erection duration to be 1 to 5 minutes. Irritative symptoms do not include frequency, nocturia or urgency. Obstructive symptoms do not include dribbling, incomplete emptying, an intermittent stream, a slower stream, straining or a weak stream. Pertinent negatives include no chills, dysuria, genital pain, hematuria, hesitancy or inability to urinate. The symptoms are aggravated by nothing. Risk factors include diabetes mellitus.      Review of Systems  Constitutional: Negative for fever, chills, diaphoresis, activity change, appetite change, fatigue and unexpected weight change.  HENT: Negative.   Eyes: Negative.   Respiratory: Negative for cough, chest tightness, shortness of breath, wheezing and stridor.   Cardiovascular: Negative for chest pain, palpitations and leg swelling.  Gastrointestinal: Negative  for nausea, vomiting, abdominal pain, diarrhea, constipation and abdominal distention.  Genitourinary: Negative for dysuria, hesitancy, urgency, frequency, hematuria, flank pain, decreased urine volume, discharge, scrotal swelling, enuresis, difficulty urinating, penile pain, decreased libido, incomplete emptying and nocturia.  Musculoskeletal: Positive for arthralgias (left shoulder). Negative for myalgias, back pain, joint swelling, gait problem and stiffness.  Skin: Negative for color change, pallor, rash and wound.  Neurological: Negative.  Negative for tingling and numbness.  Hematological: Negative for adenopathy. Does not bruise/bleed easily.  Psychiatric/Behavioral: Negative.        Objective:   Physical Exam  Vitals reviewed. Constitutional: He is oriented to person, place, and time. He appears well-developed and well-nourished. No distress.  HENT:  Head: Normocephalic and atraumatic.  Mouth/Throat: Oropharynx is clear and moist. No oropharyngeal exudate.  Eyes: Conjunctivae are normal. Right eye exhibits no discharge. Left eye exhibits no discharge. No scleral icterus.  Neck: Normal range of motion. Neck supple. No JVD present. No tracheal deviation present. No thyromegaly present.  Cardiovascular: Normal rate, regular rhythm, normal heart sounds and intact distal pulses.  Exam reveals no gallop and no friction rub.   No murmur heard. Pulmonary/Chest: Effort normal and breath sounds normal. No stridor. No respiratory distress. He has no wheezes. He has no rales. He exhibits no tenderness.  Abdominal: Soft. Bowel sounds are normal. He exhibits no distension and no mass. There is no tenderness. There is no rebound and no guarding.  Genitourinary: Rectum normal, prostate normal and penis normal. Guaiac negative stool. No penile tenderness.  Musculoskeletal: Normal range of motion. He exhibits no edema and no tenderness.  Lymphadenopathy:    He has no cervical adenopathy.    Neurological: He is oriented to person, place, and time.  Skin:  Skin is warm and dry. No rash noted. He is not diaphoretic. No erythema. No pallor.  Psychiatric: He has a normal mood and affect. His behavior is normal. Judgment and thought content normal.      Lab Results  Component Value Date   WBC 5.3 01/04/2011   HGB 15.4 01/04/2011   HCT 44.8 01/04/2011   PLT 253.0 01/04/2011   GLUCOSE 62* 01/04/2011   CHOL 258* 01/04/2011   TRIG 321.0* 01/04/2011   HDL 38.00* 01/04/2011   LDLDIRECT 206.4 01/04/2011   ALT 30 01/04/2011   AST 26 01/04/2011   NA 139 01/04/2011   K 4.0 01/04/2011   CL 103 01/04/2011   CREATININE 1.3 01/04/2011   BUN 17 01/04/2011   CO2 28 01/04/2011   TSH 2.58 01/04/2011   INR 0.9 01/02/2007   HGBA1C 6.0 01/04/2011      Assessment & Plan:

## 2011-05-26 NOTE — Patient Instructions (Signed)
Shoulder Pain The shoulder is a ball and socket joint. The muscles and tendons (rotator cuff) are what keep the shoulder in its joint and stable. This collection of muscles and tendons holds in the head (ball) of the humerus (upper arm bone) in the fossa (cup) of the scapula (shoulder blade). Today no reason was found for your shoulder pain. Often pain in the shoulder may be treated conservatively with temporary immobilization. For example, holding the shoulder in one place using a sling for rest. Physical therapy may be needed if problems continue. HOME CARE INSTRUCTIONS   Apply ice to the sore area for 15 to 20 minutes, 3 to 4 times per day for the first 2 days. Put the ice in a plastic bag. Place a towel between the bag of ice and your skin.   If you have or were given a shoulder sling and straps, do not remove for as long as directed by your caregiver or until you see a caregiver for a follow-up examination. If you need to remove it to shower or bathe, move your arm as little as possible.   Sleep on several pillows at night to lessen swelling and pain.   Only take over-the-counter or prescription medicines for pain, discomfort, or fever as directed by your caregiver.   Keep any follow-up appointments in order to avoid any type of permanent shoulder disability or chronic pain problems.  SEEK MEDICAL CARE IF:   Pain in your shoulder increases or new pain develops in your arm, hand, or fingers.   Your hand or fingers are colder than your other hand.   You do not obtain pain relief with the medications or your pain becomes worse.  SEEK IMMEDIATE MEDICAL CARE IF:   Your arm, hand, or fingers are numb or tingling.   Your arm, hand, or fingers are swollen, painful, or turn white or blue.   You develop chest pain or shortness of breath.  MAKE SURE YOU:   Understand these instructions.   Will watch your condition.   Will get help right away if you are not doing well or get worse.    Document Released: 11/09/2004 Document Revised: 01/19/2011 Document Reviewed: 01/14/2011 [facility] Patient Information 2012 [place], [place].Hypertriglyceridemia  Diet for High blood levels of Triglycerides Most fats in food are triglycerides. Triglycerides in your blood are stored as fat in your body. High levels of triglycerides in your blood may put you at a greater risk for heart disease and stroke.  Normal triglyceride levels are less than 150 mg/dL. Borderline high levels are 150-199 mg/dl. High levels are 200 - 499 mg/dL, and very high triglyceride levels are greater than 500 mg/dL. The decision to treat high triglycerides is generally based on the level. For people with borderline or high triglyceride levels, treatment includes weight loss and exercise. Drugs are recommended for people with very high triglyceride levels. Many people who need treatment for high triglyceride levels have metabolic syndrome. This syndrome is a collection of disorders that often include: insulin resistance, high blood pressure, blood clotting problems, high cholesterol and triglycerides. TESTING PROCEDURE FOR TRIGLYCERIDES  You should not eat 4 hours before getting your triglycerides measured. The normal range of triglycerides is between 10 and 250 milligrams per deciliter (mg/dl). Some people may have extreme levels (1000 or above), but your triglyceride level may be too high if it is above 150 mg/dl, depending on what other risk factors you have for heart disease.   People with high blood triglycerides may  also have high blood cholesterol levels. If you have high blood cholesterol as well as high blood triglycerides, your risk for heart disease is probably greater than if you only had high triglycerides. High blood cholesterol is one of the main risk factors for heart disease.  CHANGING YOUR DIET  Your weight can affect your blood triglyceride level. If you are more than 20% above your ideal body weight, you may be  able to lower your blood triglycerides by losing weight. Eating less and exercising regularly is the best way to combat this. Fat provides more calories than any other food. The best way to lose weight is to eat less fat. Only 30% of your total calories should come from fat. Less than 7% of your diet should come from saturated fat. A diet low in fat and saturated fat is the same as a diet to decrease blood cholesterol. By eating a diet lower in fat, you may lose weight, lower your blood cholesterol, and lower your blood triglyceride level.  Eating a diet low in fat, especially saturated fat, may also help you lower your blood triglyceride level. Ask your dietitian to help you figure how much fat you can eat based on the number of calories your caregiver has prescribed for you.  Exercise, in addition to helping with weight loss may also help lower triglyceride levels.   Alcohol can increase blood triglycerides. You may need to stop drinking alcoholic beverages.   Too much carbohydrate in your diet may also increase your blood triglycerides. Some complex carbohydrates are necessary in your diet. These may include bread, rice, potatoes, other starchy vegetables and cereals.   Reduce "simple" carbohydrates. These may include pure sugars, candy, honey, and jelly without losing other nutrients. If you have the kind of high blood triglycerides that is affected by the amount of carbohydrates in your diet, you will need to eat less sugar and less high-sugar foods. Your caregiver can help you with this.   Adding 2-4 grams of fish oil (EPA+ DHA) may also help lower triglycerides. Speak with your caregiver before adding any supplements to your regimen.  Following the Diet  Maintain your ideal weight. Your caregivers can help you with a diet. Generally, eating less food and getting more exercise will help you lose weight. Joining a weight control group may also help. Ask your caregivers for a good weight control  group in your area.  Eat low-fat foods instead of high-fat foods. This can help you lose weight too.  These foods are lower in fat. Eat MORE of these:   Dried beans, peas, and lentils.   Egg whites.   Low-fat cottage cheese.   Fish.   Lean cuts of meat, such as round, sirloin, rump, and flank (cut extra fat off meat you fix).   Whole grain breads, cereals and pasta.   Skim and nonfat dry milk.   Low-fat yogurt.   Poultry without the skin.   Cheese made with skim or part-skim milk, such as mozzarella, parmesan, farmers', ricotta, or pot cheese.  These are higher fat foods. Eat LESS of these:   Whole milk and foods made from whole milk, such as American, blue, cheddar, monterey jack, and swiss cheese   High-fat meats, such as luncheon meats, sausages, knockwurst, bratwurst, hot dogs, ribs, corned beef, ground pork, and regular ground beef.   Fried foods.  Limit saturated fats in your diet. Substituting unsaturated fat for saturated fat may decrease your blood triglyceride level. You will need  to read package labels to know which products contain saturated fats.  These foods are high in saturated fat. Eat LESS of these:   Fried pork skins.   Whole milk.   Skin and fat from poultry.   Palm oil.   Butter.   Shortening.   Cream cheese.   Tomasa Blase.   Margarines and baked goods made from listed oils.   Vegetable shortenings.   Chitterlings.   Fat from meats.   Coconut oil.   Palm kernel oil.   Lard.   Cream.   Sour cream.   Fatback.   Coffee whiteners and non-dairy creamers made with these oils.   Cheese made from whole milk.  Use unsaturated fats (both polyunsaturated and monounsaturated) moderately. Remember, even though unsaturated fats are better than saturated fats; you still want a diet low in total fat.  These foods are high in unsaturated fat:   Canola oil.   Sunflower oil.   Mayonnaise.   Almonds.   Peanuts.   Pine nuts.    Margarines made with these oils.   Safflower oil.   Olive oil.   Avocados.   Cashews.   Peanut butter.   Sunflower seeds.   Soybean oil.   Peanut oil.   Olives.   Pecans.   Walnuts.   Pumpkin seeds.  Avoid sugar and other high-sugar foods. This will decrease carbohydrates without decreasing other nutrients. Sugar in your food goes rapidly to your blood. When there is excess sugar in your blood, your liver may use it to make more triglycerides. Sugar also contains calories without other important nutrients.  Eat LESS of these:   Sugar, brown sugar, powdered sugar, jam, jelly, preserves, honey, syrup, molasses, pies, candy, cakes, cookies, frosting, pastries, colas, soft drinks, punches, fruit drinks, and regular gelatin.   Avoid alcohol. Alcohol, even more than sugar, may increase blood triglycerides. In addition, alcohol is high in calories and low in nutrients. Ask for sparkling water, or a diet soft drink instead of an alcoholic beverage.  Suggestions for planning and preparing meals   Bake, broil, grill or roast meats instead of frying.   Remove fat from meats and skin from poultry before cooking.   Add spices, herbs, lemon juice or vinegar to vegetables instead of salt, rich sauces or gravies.   Use a non-stick skillet without fat or use no-stick sprays.   Cool and refrigerate stews and broth. Then remove the hardened fat floating on the surface before serving.   Refrigerate meat drippings and skim off fat to make low-fat gravies.   Serve more fish.   Use less butter, margarine and other high-fat spreads on bread or vegetables.   Use skim or reconstituted non-fat dry milk for cooking.   Cook with low-fat cheeses.   Substitute low-fat yogurt or cottage cheese for all or part of the sour cream in recipes for sauces, dips or congealed salads.   Use half yogurt/half mayonnaise in salad recipes.   Substitute evaporated skim milk for cream. Evaporated skim  milk or reconstituted non-fat dry milk can be whipped and substituted for whipped cream in certain recipes.   Choose fresh fruits for dessert instead of high-fat foods such as pies or cakes. Fruits are naturally low in fat.  When Dining Out   Order low-fat appetizers such as fruit or vegetable juice, pasta with vegetables or tomato sauce.   Select clear, rather than cream soups.   Ask that dressings and gravies be served on the side. Then use  less of them.   Order foods that are baked, broiled, poached, steamed, stir-fried, or roasted.   Ask for margarine instead of butter, and use only a small amount.   Drink sparkling water, unsweetened tea or coffee, or diet soft drinks instead of alcohol or other sweet beverages.  QUESTIONS AND ANSWERS ABOUT OTHER FATS IN THE BLOOD: SATURATED FAT, TRANS FAT, AND CHOLESTEROL What is trans fat? Trans fat is a type of fat that is formed when vegetable oil is hardened through a process called hydrogenation. This process helps makes foods more solid, gives them shape, and prolongs their shelf life. Trans fats are also called hydrogenated or partially hydrogenated oils.  What do saturated fat, trans fat, and cholesterol in foods have to do with heart disease? Saturated fat, trans fat, and cholesterol in the diet all raise the level of LDL "bad" cholesterol in the blood. The higher the LDL cholesterol, the greater the risk for coronary heart disease (CHD). Saturated fat and trans fat raise LDL similarly.  What foods contain saturated fat, trans fat, and cholesterol? High amounts of saturated fat are found in animal products, such as fatty cuts of meat, chicken skin, and full-fat dairy products like butter, whole milk, cream, and cheese, and in tropical vegetable oils such as palm, palm kernel, and coconut oil. Trans fat is found in some of the same foods as saturated fat, such as vegetable shortening, some margarines (especially hard or stick margarine), crackers,  cookies, baked goods, fried foods, salad dressings, and other processed foods made with partially hydrogenated vegetable oils. Small amounts of trans fat also occur naturally in some animal products, such as milk products, beef, and lamb. Foods high in cholesterol include liver, other organ meats, egg yolks, shrimp, and full-fat dairy products. How can I use the new food label to make heart-healthy food choices? Check the Nutrition Facts panel of the food label. Choose foods lower in saturated fat, trans fat, and cholesterol. For saturated fat and cholesterol, you can also use the Percent Daily Value (%DV): 5% DV or less is low, and 20% DV or more is high. (There is no %DV for trans fat.) Use the Nutrition Facts panel to choose foods low in saturated fat and cholesterol, and if the trans fat is not listed, read the ingredients and limit products that list shortening or hydrogenated or partially hydrogenated vegetable oil, which tend to be high in trans fat. POINTS TO REMEMBER: YOU NEED A LITTLE TLC (THERAPEUTIC LIFESTYLE CHANGES)  Discuss your risk for heart disease with your caregivers, and take steps to reduce risk factors.   Change your diet. Choose foods that are low in saturated fat, trans fat, and cholesterol.   Add exercise to your daily routine if it is not already being done. Participate in physical activity of moderate intensity, like brisk walking, for at least 30 minutes on most, and preferably all days of the week. No time? Break the 30 minutes into three, 10-minute segments during the day.   Stop smoking. If you do smoke, contact your caregiver to discuss ways in which they can help you quit.   Do not use street drugs.   Maintain a normal weight.   Maintain a healthy blood pressure.   Keep up with your blood work for checking the fats in your blood as directed by your caregiver.  Document Released: 11/18/2003 Document Revised: 01/19/2011 Document Reviewed: 06/15/2008 [facility]  Patient Information 2012 [place], [place].

## 2011-05-27 NOTE — Assessment & Plan Note (Signed)
I will check his a1c today and will monitor his renal function 

## 2011-05-27 NOTE — Assessment & Plan Note (Signed)
I will check a plain film today and start some nsaids and will follow from there

## 2011-05-27 NOTE — Assessment & Plan Note (Signed)
I will recheck his FLP today 

## 2011-05-27 NOTE — Assessment & Plan Note (Signed)
He agrees to try to quit smoking 

## 2011-05-27 NOTE — Assessment & Plan Note (Signed)
Check labs today to look for secondary causes, I suspect that this is caused by DM, treat with cialis

## 2011-05-27 NOTE — Assessment & Plan Note (Signed)
No s/s today 

## 2011-05-29 ENCOUNTER — Encounter: Payer: Self-pay | Admitting: Internal Medicine

## 2011-05-29 ENCOUNTER — Other Ambulatory Visit: Payer: Self-pay | Admitting: Internal Medicine

## 2011-05-29 DIAGNOSIS — E1010 Type 1 diabetes mellitus with ketoacidosis without coma: Secondary | ICD-10-CM

## 2011-05-29 DIAGNOSIS — E782 Mixed hyperlipidemia: Secondary | ICD-10-CM

## 2011-05-29 LAB — PSA: PSA: 0.37 ng/mL (ref 0.10–4.00)

## 2011-05-29 LAB — LDL CHOLESTEROL, DIRECT: Direct LDL: 169.7 mg/dL

## 2011-05-29 MED ORDER — PITAVASTATIN CALCIUM 4 MG PO TABS: 1.0000 | ORAL_TABLET | Freq: Every day | ORAL | Status: DC

## 2011-06-02 ENCOUNTER — Ambulatory Visit: Payer: Managed Care, Other (non HMO) | Admitting: Internal Medicine

## 2011-06-09 ENCOUNTER — Ambulatory Visit: Payer: Managed Care, Other (non HMO) | Admitting: Internal Medicine

## 2011-06-09 DIAGNOSIS — Z0289 Encounter for other administrative examinations: Secondary | ICD-10-CM

## 2012-04-30 ENCOUNTER — Other Ambulatory Visit (INDEPENDENT_AMBULATORY_CARE_PROVIDER_SITE_OTHER): Payer: Managed Care, Other (non HMO)

## 2012-04-30 ENCOUNTER — Ambulatory Visit (INDEPENDENT_AMBULATORY_CARE_PROVIDER_SITE_OTHER): Payer: Managed Care, Other (non HMO) | Admitting: Internal Medicine

## 2012-04-30 ENCOUNTER — Encounter: Payer: Self-pay | Admitting: Internal Medicine

## 2012-04-30 VITALS — BP 118/78 | HR 89 | Temp 98.2°F | Resp 16 | Wt 215.0 lb

## 2012-04-30 DIAGNOSIS — E782 Mixed hyperlipidemia: Secondary | ICD-10-CM

## 2012-04-30 DIAGNOSIS — E1010 Type 1 diabetes mellitus with ketoacidosis without coma: Secondary | ICD-10-CM

## 2012-04-30 DIAGNOSIS — D869 Sarcoidosis, unspecified: Secondary | ICD-10-CM

## 2012-04-30 LAB — COMPREHENSIVE METABOLIC PANEL
Albumin: 4.9 g/dL (ref 3.5–5.2)
CO2: 25 mEq/L (ref 19–32)
Calcium: 9.5 mg/dL (ref 8.4–10.5)
Chloride: 99 mEq/L (ref 96–112)
GFR: 75.88 mL/min (ref >60.00)
Glucose, Bld: 103 mg/dL — ABNORMAL HIGH (ref 70–99)
Sodium: 136 mEq/L (ref 135–145)
Total Bilirubin: 0.8 mg/dL (ref 0.3–1.2)
Total Protein: 9 g/dL — ABNORMAL HIGH (ref 6.0–8.3)

## 2012-04-30 LAB — LIPID PANEL
Total CHOL/HDL Ratio: 7
Triglycerides: 69 mg/dL (ref 0.0–149.0)

## 2012-04-30 LAB — CBC WITH DIFFERENTIAL/PLATELET
Eosinophils Relative: 0.1 % (ref 0.0–5.0)
HCT: 43.5 % (ref 39.0–52.0)
Hemoglobin: 14.5 g/dL (ref 13.0–17.0)
Lymphocytes Relative: 23.1 % (ref 12.0–46.0)
Lymphs Abs: 1.4 10*3/uL (ref 0.7–4.0)
Monocytes Relative: 8.3 % (ref 3.0–12.0)
Platelets: 306 10*3/uL (ref 150.0–400.0)
WBC: 6.2 10*3/uL (ref 4.5–10.5)

## 2012-04-30 LAB — URINALYSIS, ROUTINE W REFLEX MICROSCOPIC
Bilirubin Urine: NEGATIVE
Hgb urine dipstick: NEGATIVE
Leukocytes, UA: NEGATIVE
Nitrite: NEGATIVE

## 2012-04-30 LAB — RPR

## 2012-04-30 LAB — CK: Total CK: 1270 U/L — ABNORMAL HIGH (ref 7–232)

## 2012-04-30 MED ORDER — METRONIDAZOLE 500 MG PO TABS: 500.0000 mg | ORAL_TABLET | Freq: Three times a day (TID) | ORAL | Status: DC

## 2012-04-30 NOTE — Assessment & Plan Note (Signed)
FLP

## 2012-04-30 NOTE — Patient Instructions (Signed)
Trichomoniasis Trichomoniasis is an infection, caused by the Trichomonas organism, that affects both women and men. In women, the outer male genitalia and the vagina are affected. In men, the penis is mainly affected, but the prostate and other reproductive organs can also be involved. Trichomoniasis is a sexually transmitted disease (STD) and is most often passed to another person through sexual contact. The majority of people who get trichomoniasis do so from a sexual encounter and are also at risk for other STDs. CAUSES   Sexual intercourse with an infected partner.  It can be present in swimming pools or hot tubs. SYMPTOMS   Abnormal gray-green frothy vaginal discharge in women.  Vaginal itching and irritation in women.  Itching and irritation of the area outside the vagina in women.  Penile discharge with or without pain in males.  Inflammation of the urethra (urethritis), causing painful urination.  Bleeding after sexual intercourse. RELATED COMPLICATIONS  Pelvic inflammatory disease.  Infection of the uterus (endometritis).  Infertility.  Tubal (ectopic) pregnancy.  It can be associated with other STDs, including gonorrhea and chlamydia, hepatitis B, and HIV. COMPLICATIONS DURING PREGNANCY  Early (premature) delivery.  Premature rupture of the membranes (PROM).  Low birth weight. DIAGNOSIS   Visualization of Trichomonas under the microscope from the vagina discharge.  Ph of the vagina greater than 4.5, tested with a test tape.  Trich Rapid Test.  Culture of the organism, but this is not usually needed.  It may be found on a Pap test.  Having a "strawberry cervix,"which means the cervix looks very red like a strawberry. TREATMENT   You may be given medication to fight the infection. Inform your caregiver if you could be or are pregnant. Some medications used to treat the infection should not be taken during pregnancy.  Over-the-counter medications or  creams to decrease itching or irritation may be recommended.  Your sexual partner will need to be treated if infected. HOME CARE INSTRUCTIONS   Take all medication prescribed by your caregiver.  Take over-the-counter medication for itching or irritation as directed by your caregiver.  Do not have sexual intercourse while you have the infection.  Do not douche or wear tampons.  Discuss your infection with your partner, as your partner may have acquired the infection from you. Or, your partner may have been the person who transmitted the infection to you.  Have your sex partner examined and treated if necessary.  Practice safe, informed, and protected sex.  See your caregiver for other STD testing. SEEK MEDICAL CARE IF:   You still have symptoms after you finish the medication.  You have an oral temperature above 102 F (38.9 C).  You develop belly (abdominal) pain.  You have pain when you urinate.  You have bleeding after sexual intercourse.  You develop a rash.  The medication makes you sick or makes you throw up (vomit). Document Released: 07/26/2000 Document Revised: 04/24/2011 Document Reviewed: 08/21/2008 ExitCare Patient Information 2013 [employer].  

## 2012-04-30 NOTE — Assessment & Plan Note (Signed)
He has no s/s I will check his labs today to look for complications

## 2012-04-30 NOTE — Assessment & Plan Note (Signed)
I will check his labs for syph, GC, chlamydia Will look at his UA Will treat for trich

## 2012-04-30 NOTE — Assessment & Plan Note (Signed)
Check UA Start flagyl

## 2012-04-30 NOTE — Progress Notes (Signed)
Subjective:    Patient ID: [name] [name], male    DOB: 09-09-1980, 32 y.o.   MRN: [id number]  Dysuria  This is a new problem. The current episode started in the past 7 days. The problem occurs intermittently. The problem has been unchanged. The quality of the pain is described as burning. The pain is at a severity of 0/10. The patient is experiencing no pain. There has been no fever. The fever has been present for less than 1 day. He is sexually active (his GF has trich). There is no history of pyelonephritis. Pertinent negatives include no chills, discharge, flank pain, frequency, hematuria, hesitancy, nausea, possible pregnancy, sweats, urgency or vomiting. He has tried nothing for the symptoms.      Review of Systems  Constitutional: Negative for fever, chills, diaphoresis, activity change, appetite change, fatigue and unexpected weight change.  HENT: Negative.   Eyes: Negative.   Respiratory: Negative.  Negative for cough, chest tightness, shortness of breath and wheezing.   Cardiovascular: Negative.  Negative for leg swelling.  Gastrointestinal: Negative for nausea, vomiting, abdominal pain, diarrhea and constipation.  Endocrine: Negative.   Genitourinary: Positive for dysuria. Negative for hesitancy, urgency, frequency, hematuria, flank pain, decreased urine volume, discharge, penile swelling, scrotal swelling, enuresis, difficulty urinating, genital sores, penile pain and testicular pain.  Musculoskeletal: Negative.   Skin: Negative.   Allergic/Immunologic: Negative.   Neurological: Negative.   Hematological: Negative for adenopathy. Does not bruise/bleed easily.  Psychiatric/Behavioral: Negative.        Objective:   Physical Exam  Vitals reviewed. Constitutional: He is oriented to person, place, and time. He appears well-developed and well-nourished. No distress.  HENT:  Head: Normocephalic and atraumatic.  Mouth/Throat: Oropharynx is clear and moist. No oropharyngeal  exudate.  Eyes: Conjunctivae are normal. Right eye exhibits no discharge. Left eye exhibits no discharge. No scleral icterus.  Neck: Normal range of motion. Neck supple. No JVD present. No tracheal deviation present. No thyromegaly present.  Cardiovascular: Normal rate, regular rhythm, normal heart sounds and intact distal pulses.  Exam reveals no gallop and no friction rub.   No murmur heard. Pulmonary/Chest: Effort normal and breath sounds normal. No stridor. No respiratory distress. He has no wheezes. He has no rales. He exhibits no tenderness.  Abdominal: Soft. Bowel sounds are normal. He exhibits no distension and no mass. There is no tenderness. There is no rebound and no guarding. Hernia confirmed negative in the right inguinal area and confirmed negative in the left inguinal area.  Genitourinary: Testes normal and penis normal. Right testis shows no mass, no swelling and no tenderness. Right testis is descended. Left testis shows no mass, no swelling and no tenderness. Left testis is descended. Circumcised. No penile erythema or penile tenderness. No discharge found.  Musculoskeletal: Normal range of motion. He exhibits no edema and no tenderness.  Lymphadenopathy:    He has no cervical adenopathy.    He has no axillary adenopathy.       Right: No inguinal adenopathy present.       Left: No inguinal adenopathy present.  Neurological: He is oriented to person, place, and time.  Skin: Skin is warm and dry. No rash noted. He is not diaphoretic. No erythema. No pallor.  Psychiatric: He has a normal mood and affect. His behavior is normal. Judgment and thought content normal.      Lab Results  Component Value Date   WBC 5.3 05/26/2011   HGB 14.6 05/26/2011   HCT 44.3 05/26/2011  PLT 212.0 05/26/2011   GLUCOSE 88 05/26/2011   CHOL 235* 05/26/2011   TRIG 247.0* 05/26/2011   HDL 39.40 05/26/2011   LDLDIRECT 169.7 05/26/2011   ALT 22 05/26/2011   AST 21 05/26/2011   NA 138 05/26/2011   K 3.9  05/26/2011   CL 101 05/26/2011   CREATININE 1.3 05/26/2011   BUN 14 05/26/2011   CO2 30 05/26/2011   TSH 3.90 05/26/2011   PSA 0.37 05/26/2011   INR 0.9 01/02/2007   HGBA1C 5.6 05/26/2011      Assessment & Plan:

## 2012-05-01 ENCOUNTER — Encounter: Payer: Self-pay | Admitting: Internal Medicine

## 2012-05-01 MED ORDER — POTASSIUM CHLORIDE ER 8 MEQ PO TBCR
8.0000 meq | EXTENDED_RELEASE_TABLET | Freq: Three times a day (TID) | ORAL | Status: DC
Start: 2012-05-01 — End: 2016-04-04

## 2012-05-01 NOTE — Addendum Note (Signed)
Addended by: Etta Grandchild on: 05/01/2012 07:36 AM   Modules accepted: Orders

## 2013-08-01 ENCOUNTER — Ambulatory Visit (INDEPENDENT_AMBULATORY_CARE_PROVIDER_SITE_OTHER)
Admission: RE | Admit: 2013-08-01 | Discharge: 2013-08-01 | Disposition: A | Discharge status: Home / Self Care | Payer: Managed Care, Other (non HMO) | Source: Ambulatory Visit | Attending: Internal Medicine | Admitting: Internal Medicine

## 2013-08-01 ENCOUNTER — Encounter: Payer: Self-pay | Admitting: Internal Medicine

## 2013-08-01 ENCOUNTER — Other Ambulatory Visit (INDEPENDENT_AMBULATORY_CARE_PROVIDER_SITE_OTHER): Payer: Managed Care, Other (non HMO)

## 2013-08-01 ENCOUNTER — Ambulatory Visit (INDEPENDENT_AMBULATORY_CARE_PROVIDER_SITE_OTHER): Payer: Managed Care, Other (non HMO) | Admitting: Internal Medicine

## 2013-08-01 VITALS — BP 130/82 | HR 81 | Temp 98.1°F | Resp 18 | Wt 219.0 lb

## 2013-08-01 DIAGNOSIS — R059 Cough, unspecified: Secondary | ICD-10-CM

## 2013-08-01 DIAGNOSIS — E876 Hypokalemia: Secondary | ICD-10-CM

## 2013-08-01 DIAGNOSIS — E1010 Type 1 diabetes mellitus with ketoacidosis without coma: Secondary | ICD-10-CM

## 2013-08-01 DIAGNOSIS — R2232 Localized swelling, mass and lump, left upper limb: Secondary | ICD-10-CM

## 2013-08-01 DIAGNOSIS — R229 Localized swelling, mass and lump, unspecified: Secondary | ICD-10-CM

## 2013-08-01 DIAGNOSIS — J309 Allergic rhinitis, unspecified: Secondary | ICD-10-CM | POA: Insufficient documentation

## 2013-08-01 DIAGNOSIS — R2230 Localized swelling, mass and lump, unspecified upper limb: Secondary | ICD-10-CM | POA: Insufficient documentation

## 2013-08-01 DIAGNOSIS — R05 Cough: Secondary | ICD-10-CM

## 2013-08-01 DIAGNOSIS — E785 Hyperlipidemia, unspecified: Secondary | ICD-10-CM

## 2013-08-01 DIAGNOSIS — D869 Sarcoidosis, unspecified: Secondary | ICD-10-CM

## 2013-08-01 LAB — CBC WITH DIFFERENTIAL/PLATELET
BASOS PCT: 0.4 % (ref 0.0–3.0)
Basophils Absolute: 0 10*3/uL (ref 0.0–0.1)
EOS ABS: 0.2 10*3/uL (ref 0.0–0.7)
Eosinophils Relative: 2.7 % (ref 0.0–5.0)
HCT: 42.6 % (ref 39.0–52.0)
Hemoglobin: 14 g/dL (ref 13.0–17.0)
LYMPHS PCT: 26 % (ref 12.0–46.0)
Lymphs Abs: 1.5 10*3/uL (ref 0.7–4.0)
MCHC: 32.8 g/dL (ref 30.0–36.0)
MCV: 82.4 fl (ref 78.0–100.0)
Monocytes Absolute: 0.7 10*3/uL (ref 0.1–1.0)
Monocytes Relative: 12.1 % — ABNORMAL HIGH (ref 3.0–12.0)
NEUTROS PCT: 58.8 % (ref 43.0–77.0)
Neutro Abs: 3.4 10*3/uL (ref 1.4–7.7)
Platelets: 253 10*3/uL (ref 150.0–400.0)
RBC: 5.18 Mil/uL (ref 4.22–5.81)
RDW: 13.7 % (ref 11.5–15.5)
WBC: 5.7 10*3/uL (ref 4.0–10.5)

## 2013-08-01 LAB — LIPID PANEL
CHOLESTEROL: 225 mg/dL — AB (ref 0–200)
HDL: 30.3 mg/dL — ABNORMAL LOW (ref >39.00)
LDL Cholesterol: 152 mg/dL — ABNORMAL HIGH (ref 0–99)
NonHDL: 194.7
TRIGLYCERIDES: 216 mg/dL — AB (ref 0.0–149.0)
Total CHOL/HDL Ratio: 7
VLDL: 43.2 mg/dL — ABNORMAL HIGH (ref 0.0–40.0)

## 2013-08-01 LAB — COMPREHENSIVE METABOLIC PANEL
ALBUMIN: 4.7 g/dL (ref 3.5–5.2)
ALT: 27 U/L (ref 0–53)
AST: 29 U/L (ref 0–37)
Alkaline Phosphatase: 32 U/L — ABNORMAL LOW (ref 39–117)
BUN: 12 mg/dL (ref 6–23)
CALCIUM: 9.4 mg/dL (ref 8.4–10.5)
CHLORIDE: 104 meq/L (ref 96–112)
CO2: 26 meq/L (ref 19–32)
Creatinine, Ser: 1.4 mg/dL (ref 0.4–1.5)
GFR: 73.48 mL/min (ref >60.00)
GLUCOSE: 94 mg/dL (ref 70–99)
POTASSIUM: 4 meq/L (ref 3.5–5.1)
Sodium: 138 mEq/L (ref 135–145)
Total Bilirubin: 0.7 mg/dL (ref 0.2–1.2)
Total Protein: 7.9 g/dL (ref 6.0–8.3)

## 2013-08-01 LAB — HEMOGLOBIN A1C: Hgb A1c MFr Bld: 6.3 % (ref 4.6–6.5)

## 2013-08-01 LAB — SEDIMENTATION RATE: Sed Rate: 12 mm/hr (ref 0–22)

## 2013-08-01 LAB — TSH: TSH: 3.22 u[IU]/mL (ref 0.35–4.50)

## 2013-08-01 MED ORDER — MOMETASONE FUROATE 50 MCG/ACT NA SUSP: 4.0000 | Freq: Every day | NASAL | Status: DC

## 2013-08-01 NOTE — Progress Notes (Signed)
Subjective:    Patient ID: [name] [name], male    DOB: 06/12/1980, 33 y.o.   MRN: [id number]  Cough This is a recurrent problem. The current episode started in the past 7 days. The problem has been gradually improving. The problem occurs every few hours. The cough is productive of purulent sputum. Associated symptoms include nasal congestion, postnasal drip and rhinorrhea. Pertinent negatives include no chest pain, chills, ear congestion, ear pain, fever, headaches, heartburn, hemoptysis, myalgias, rash, sore throat, shortness of breath, sweats, weight loss or wheezing. Nothing aggravates the symptoms. He has tried OTC cough suppressant for the symptoms. The treatment provided moderate relief.      Review of Systems  Constitutional: Negative.  Negative for fever, chills, weight loss, diaphoresis, appetite change and fatigue.  HENT: Positive for congestion, postnasal drip, rhinorrhea and sneezing. Negative for ear discharge, ear pain, nosebleeds, sore throat and trouble swallowing.   Eyes: Negative.   Respiratory: Positive for cough. Negative for hemoptysis, shortness of breath and wheezing.   Cardiovascular: Negative.  Negative for chest pain, palpitations and leg swelling.  Gastrointestinal: Negative.  Negative for heartburn, nausea, vomiting, abdominal pain, diarrhea, constipation and blood in stool.  Endocrine: Negative.  Negative for polydipsia, polyphagia and polyuria.  Genitourinary: Negative.   Musculoskeletal: Negative.  Negative for arthralgias, back pain, gait problem, joint swelling, myalgias, neck pain and neck stiffness.       He complains of a mass in his left axilla  Skin: Negative.  Negative for color change, pallor, rash and wound.  Allergic/Immunologic: Negative.   Neurological: Negative.  Negative for headaches.  Hematological: Negative.  Negative for adenopathy. Does not bruise/bleed easily.  Psychiatric/Behavioral: Negative.        Objective:   Physical Exam    Vitals reviewed. Constitutional: He is oriented to person, place, and time. He appears well-developed and well-nourished.  Non-toxic appearance. He does not have a sickly appearance. He does not appear ill. No distress.  HENT:  Head: Normocephalic and atraumatic.  Right Ear: Hearing, tympanic membrane, external ear and ear canal normal.  Left Ear: Hearing, tympanic membrane, external ear and ear canal normal.  Nose: Mucosal edema and rhinorrhea present. No nose lacerations, sinus tenderness, nasal deformity, septal deviation or nasal septal hematoma. No epistaxis.  No foreign bodies. Right sinus exhibits no maxillary sinus tenderness and no frontal sinus tenderness. Left sinus exhibits no maxillary sinus tenderness and no frontal sinus tenderness.  Mouth/Throat: Oropharynx is clear and moist. No oropharyngeal exudate.  Eyes: Conjunctivae are normal. Right eye exhibits no discharge. Left eye exhibits no discharge. No scleral icterus.  Neck: Normal range of motion. Neck supple. No JVD present. No tracheal deviation present. No thyromegaly present.  Cardiovascular: Normal rate, regular rhythm, normal heart sounds and intact distal pulses.  Exam reveals no gallop and no friction rub.   No murmur heard. Pulmonary/Chest: Effort normal and breath sounds normal. No stridor. No respiratory distress. He has no rales. He exhibits no tenderness.  Abdominal: Soft. Bowel sounds are normal. He exhibits no distension and no mass. There is no tenderness. There is no rebound and no guarding.  Musculoskeletal: Normal range of motion. He exhibits no edema.  Lymphadenopathy:    He has no cervical adenopathy.  Neurological: He is oriented to person, place, and time.  Skin: Skin is warm, dry and intact. No rash noted. He is not diaphoretic. No erythema. No pallor.  In the left upper/outer axilla there is a 2.5 cm irregularly shaped hard mass that  is not mobile and is not fluctuance, non-tender, not indurated. The  overlying skin is normal.  Psychiatric: He has a normal mood and affect. His behavior is normal. Judgment and thought content normal.    Lab Results  Component Value Date   WBC 6.2 04/30/2012   HGB 14.5 04/30/2012   HCT 43.5 04/30/2012   PLT 306.0 04/30/2012   GLUCOSE 103* 04/30/2012   CHOL 276* 04/30/2012   TRIG 69.0 04/30/2012   HDL 38.90* 04/30/2012   LDLDIRECT 208.5 04/30/2012   ALT 37 04/30/2012   AST 37 04/30/2012   NA 136 04/30/2012   K 3.1* 04/30/2012   CL 99 04/30/2012   CREATININE 1.4 04/30/2012   BUN 13 04/30/2012   CO2 25 04/30/2012   TSH 3.90 05/26/2011   PSA 0.37 05/26/2011   INR 0.9 01/02/2007   HGBA1C 6.2 04/30/2012        Assessment & Plan:

## 2013-08-01 NOTE — Patient Instructions (Signed)
Cough, Adult  A cough is a reflex that helps clear your throat and airways. It can help heal the body or may be a reaction to an irritated airway. A cough may only last 2 or 3 weeks (acute) or may last more than 8 weeks (chronic).  CAUSES Acute cough:  Viral or bacterial infections. Chronic cough:  Infections.  Allergies.  Asthma.  Post-nasal drip.  Smoking.  Heartburn or acid reflux.  Some medicines.  Chronic lung problems (COPD).  Cancer. SYMPTOMS   Cough.  Fever.  Chest pain.  Increased breathing rate.  High-pitched whistling sound when breathing (wheezing).  Colored mucus that you cough up (sputum). TREATMENT   A bacterial cough may be treated with antibiotic medicine.  A viral cough must run its course and will not respond to antibiotics.  Your caregiver may recommend other treatments if you have a chronic cough. HOME CARE INSTRUCTIONS   Only take over-the-counter or prescription medicines for pain, discomfort, or fever as directed by your caregiver. Use cough suppressants only as directed by your caregiver.  Use a cold steam vaporizer or humidifier in your bedroom or home to help loosen secretions.  Sleep in a semi-upright position if your cough is worse at night.  Rest as needed.  Stop smoking if you smoke. SEEK IMMEDIATE MEDICAL CARE IF:   You have pus in your sputum.  Your cough starts to worsen.  You cannot control your cough with suppressants and are losing sleep.  You begin coughing up blood.  You have difficulty breathing.  You develop pain which is getting worse or is uncontrolled with medicine.  You have a fever. MAKE SURE YOU:   Understand these instructions.  Will watch your condition.  Will get help right away if you are not doing well or get worse. Document Released: 07/29/2010 Document Revised: 04/24/2011 Document Reviewed: 07/29/2010 ExitCare Patient Information 2015 [employer]. This information is not intended  to replace advice given to you by your health care provider. Make sure you discuss any questions you have with your health care provider.  

## 2013-08-01 NOTE — Progress Notes (Signed)
Pre visit review using our clinic review tool, if applicable. No additional management support is needed unless otherwise documented below in the visit note. 

## 2013-08-02 ENCOUNTER — Encounter: Payer: Self-pay | Admitting: Internal Medicine

## 2013-08-03 ENCOUNTER — Encounter: Payer: Self-pay | Admitting: Internal Medicine

## 2013-08-03 NOTE — Assessment & Plan Note (Signed)
His CXR and labs show no indication of a recurrence of this

## 2013-08-03 NOTE — Assessment & Plan Note (Signed)
His blood sugars are well controlled 

## 2013-08-03 NOTE — Assessment & Plan Note (Signed)
His CXR is normal This appears to be a viral URI Labs show no concern for a sarcoid flare

## 2013-08-03 NOTE — Assessment & Plan Note (Signed)
I have asked him to see GS to consider having a biopsy done to see if this is sarcoid, cancer, etc. It does not appear to me to be an infection

## 2013-08-18 ENCOUNTER — Ambulatory Visit (INDEPENDENT_AMBULATORY_CARE_PROVIDER_SITE_OTHER): Payer: Managed Care, Other (non HMO) | Admitting: General Surgery

## 2013-08-21 ENCOUNTER — Ambulatory Visit (INDEPENDENT_AMBULATORY_CARE_PROVIDER_SITE_OTHER): Payer: Managed Care, Other (non HMO) | Admitting: Internal Medicine

## 2013-08-21 ENCOUNTER — Other Ambulatory Visit (INDEPENDENT_AMBULATORY_CARE_PROVIDER_SITE_OTHER): Payer: Managed Care, Other (non HMO)

## 2013-08-21 ENCOUNTER — Encounter: Payer: Self-pay | Admitting: Internal Medicine

## 2013-08-21 VITALS — BP 130/80 | HR 93 | Temp 98.6°F | Resp 16 | Ht 72.0 in | Wt 209.0 lb

## 2013-08-21 DIAGNOSIS — N342 Other urethritis: Secondary | ICD-10-CM

## 2013-08-21 DIAGNOSIS — R229 Localized swelling, mass and lump, unspecified: Secondary | ICD-10-CM

## 2013-08-21 DIAGNOSIS — R2232 Localized swelling, mass and lump, left upper limb: Secondary | ICD-10-CM

## 2013-08-21 LAB — URINALYSIS, ROUTINE W REFLEX MICROSCOPIC
Bilirubin Urine: NEGATIVE
Hgb urine dipstick: NEGATIVE
KETONES UR: NEGATIVE
Nitrite: NEGATIVE
RBC / HPF: NONE SEEN (ref 0–?)
Specific Gravity, Urine: 1.02 (ref 1.000–1.030)
Total Protein, Urine: NEGATIVE
Urine Glucose: NEGATIVE
Urobilinogen, UA: 0.2 (ref 0.0–1.0)
pH: 6 (ref 5.0–8.0)

## 2013-08-21 MED ORDER — CEFTRIAXONE SODIUM 500 MG IJ SOLR
500.0000 mg | Freq: Once | INTRAMUSCULAR | Status: AC
  Administered 2013-08-21: 500 mg via INTRAMUSCULAR

## 2013-08-21 MED ORDER — AZITHROMYCIN 500 MG PO TABS: 500.0000 mg | ORAL_TABLET | Freq: Every day | ORAL | Status: DC

## 2013-08-21 NOTE — Patient Instructions (Signed)
Urethritis, Adult °Urethritis is an inflammation of the tube through which urine exits your bladder (urethra).  °CAUSES °Urethritis is often caused by an infection in your urethra. The infection can be viral, like herpes. The infection can also be bacterial, like gonorrhea. °RISK FACTORS °Risk factors of urethritis include: °· Having sex without using a condom. °· Having multiple sexual partners. °· Having poor hygiene. °SIGNS AND SYMPTOMS °Symptoms of urethritis are less noticeable in women than in men. These symptoms include: °· Burning feeling when you urinate (dysuria). °· Discharge from your urethra. °· Blood in your urine (hematuria). °· Urinating more than usual. °DIAGNOSIS  °To confirm a diagnosis of urethritis, your health care provider will do the following: °· Ask about your sexual history. °· Perform a physical exam. °· Have you provide a sample of your urine for lab testing. °· Use a cotton swab to gently collect a sample from your urethra for lab testing. °TREATMENT  °It is important to treat urethritis. Depending on the cause, untreated urethritis may lead to serious genital infections and possibly infertility. Urethritis caused by a bacterial infection is treated with antibiotics. All sexual partners must be treated.  °HOME CARE INSTRUCTIONS °· Do not have sex until the test results are known and treatment is completed, even if your symptoms go away before you finish treatment. °· Finish all medicines that you are prescribed. °SEEK MEDICAL CARE IF:  °· Your symptoms are not improved in 3 days. °· Your symptoms are getting worse. °· You develop abdominal pain or pelvic pain (in women). °· You develop joint pain. °SEEK IMMEDIATE MEDICAL CARE IF:  °· You have a fever with a temperature of 101.8°F (38.8°C) or greater. °· You have severe pain in the belly, back, or side. °· You have repeated vomiting. °Document Released: 07/26/2000 Document Revised: 11/20/2012 Document Reviewed: 09/30/2012 °ExitCare®  Patient Information ©2015 [employer]. This information is not intended to replace advice given to you by your health care provider. Make sure you discuss any questions you have with your health care provider. ° °

## 2013-08-21 NOTE — Progress Notes (Signed)
Pre visit review using our clinic review tool, if applicable. No additional management support is needed unless otherwise documented below in the visit note. 

## 2013-08-21 NOTE — Progress Notes (Signed)
Subjective:    Patient ID: [name] [name], male    DOB: [birth year], 33 y.o.   MRN: [id number]  Dysuria  This is a new problem. The current episode started in the past 7 days. The problem occurs every urination. The problem has been gradually worsening. The quality of the pain is described as burning. The pain is at a severity of 1/10. The pain is mild. There has been no fever. The fever has been present for less than 1 day. He is sexually active. There is no history of pyelonephritis. Associated symptoms include a discharge. Pertinent negatives include no chills, flank pain, frequency, hematuria, hesitancy, nausea, possible pregnancy, sweats, urgency or vomiting. He has tried nothing for the symptoms. The treatment provided no relief.      Review of Systems  Constitutional: Negative.  Negative for fever, chills, diaphoresis and fatigue.  HENT: Negative.   Eyes: Negative.   Respiratory: Negative.  Negative for cough, choking, chest tightness, shortness of breath and stridor.   Cardiovascular: Negative.  Negative for chest pain, palpitations and leg swelling.  Gastrointestinal: Negative.  Negative for nausea, vomiting, abdominal pain, diarrhea, constipation and blood in stool.  Endocrine: Negative.   Genitourinary: Positive for dysuria and discharge. Negative for hesitancy, urgency, frequency, hematuria, flank pain, decreased urine volume, penile swelling, scrotal swelling, enuresis, difficulty urinating, genital sores, penile pain and testicular pain.  Musculoskeletal: Negative.   Skin: Negative.  Negative for rash.  Allergic/Immunologic: Negative.   Neurological: Negative.   Hematological: Negative.  Negative for adenopathy. Does not bruise/bleed easily.  Psychiatric/Behavioral: Negative.        Objective:   Physical Exam  Vitals reviewed. Constitutional: He is oriented to person, place, and time. He appears well-developed and well-nourished. No distress.  HENT:  Head:  Normocephalic and atraumatic.  Mouth/Throat: Oropharynx is clear and moist. No oropharyngeal exudate.  Eyes: Conjunctivae are normal. Right eye exhibits no discharge. Left eye exhibits no discharge. No scleral icterus.  Neck: Normal range of motion. Neck supple. No JVD present. No tracheal deviation present. No thyromegaly present.  Cardiovascular: Normal rate, regular rhythm, normal heart sounds and intact distal pulses.  Exam reveals no gallop and no friction rub.   No murmur heard. Pulmonary/Chest: Effort normal and breath sounds normal. No stridor. No respiratory distress. He has no wheezes. He has no rales. He exhibits no tenderness.  Abdominal: Soft. Bowel sounds are normal. He exhibits no distension and no mass. There is no tenderness. There is no rebound and no guarding. Hernia confirmed negative in the right inguinal area and confirmed negative in the left inguinal area.  Genitourinary: Testes normal. Right testis shows no mass, no swelling and no tenderness. Right testis is descended. Left testis shows no mass, no swelling and no tenderness. Left testis is descended. Circumcised. No penile erythema or penile tenderness. Discharge found.  Musculoskeletal: Normal range of motion. He exhibits no edema and no tenderness.  Lymphadenopathy:    He has no cervical adenopathy.       Right: No inguinal adenopathy present.       Left: No inguinal adenopathy present.  Left lateral axillary area reveals a 1 X 1 cm firm nodule, much smaller than before. There is no tenderness, induration, fluctuance, erythema, streaking.  Neurological: He is oriented to person, place, and time.  Skin: Skin is warm and dry. No rash noted. He is not diaphoretic. No erythema. No pallor.  Psychiatric: He has a normal mood and affect. His behavior is normal. Judgment and  thought content normal.     Lab Results  Component Value Date   WBC 5.7 08/01/2013   HGB 14.0 08/01/2013   HCT 42.6 08/01/2013   PLT 253.0 08/01/2013     GLUCOSE 94 08/01/2013   CHOL 225* 08/01/2013   TRIG 216.0* 08/01/2013   HDL 30.30* 08/01/2013   LDLDIRECT 208.5 04/30/2012   LDLCALC 152* 08/01/2013   ALT 27 08/01/2013   AST 29 08/01/2013   NA 138 08/01/2013   K 4.0 08/01/2013   CL 104 08/01/2013   CREATININE 1.4 08/01/2013   BUN 12 08/01/2013   CO2 26 08/01/2013   TSH 3.22 08/01/2013   PSA 0.37 05/26/2011   INR 0.9 01/02/2007   HGBA1C 6.3 08/01/2013       Assessment & Plan:

## 2013-08-22 ENCOUNTER — Encounter: Payer: Self-pay | Admitting: Internal Medicine

## 2013-08-22 LAB — RPR

## 2013-08-22 LAB — GC/CHLAMYDIA PROBE AMP, URINE
Chlamydia, Swab/Urine, PCR: POSITIVE — AB
GC Probe Amp, Urine: NEGATIVE

## 2013-08-24 ENCOUNTER — Encounter: Payer: Self-pay | Admitting: Internal Medicine

## 2013-08-24 NOTE — Assessment & Plan Note (Signed)
He has been treated with IM rocephin and zithromax UA is + for chlamydia

## 2013-08-24 NOTE — Assessment & Plan Note (Signed)
This has nearly completely resolved, he has decided not to see a [facility]

## 2016-04-04 ENCOUNTER — Ambulatory Visit (INDEPENDENT_AMBULATORY_CARE_PROVIDER_SITE_OTHER): Payer: BLUE CROSS/BLUE SHIELD

## 2016-04-04 ENCOUNTER — Encounter: Payer: Self-pay | Admitting: Podiatry

## 2016-04-04 ENCOUNTER — Ambulatory Visit (INDEPENDENT_AMBULATORY_CARE_PROVIDER_SITE_OTHER): Payer: BLUE CROSS/BLUE SHIELD | Admitting: Podiatry

## 2016-04-04 VITALS — BP 119/54 | HR 75 | Resp 16 | Ht 73.0 in | Wt 190.0 lb

## 2016-04-04 DIAGNOSIS — M2042 Other hammer toe(s) (acquired), left foot: Secondary | ICD-10-CM

## 2016-04-04 DIAGNOSIS — B353 Tinea pedis: Secondary | ICD-10-CM | POA: Diagnosis not present

## 2016-04-04 DIAGNOSIS — M2041 Other hammer toe(s) (acquired), right foot: Secondary | ICD-10-CM | POA: Diagnosis not present

## 2016-04-04 MED ORDER — NAFTIFINE HCL 1 % EX GEL: 1.0000 "application " | Freq: Two times a day (BID) | CUTANEOUS | 3 refills | Status: AC

## 2016-04-04 NOTE — Progress Notes (Signed)
   Subjective:    Patient ID: [name] [name], male    DOB: 12/25/1980, 36 y.o.   MRN: [id number]  HPI: He presents today with chief complaint of bilateral interdigital maceration to the fourth interdigital space. States this Whiting collar he's try to use corn pads to help alleviate the thickness in between the toes. He states that the left is worse than the right is been going on for about a month and a half or so. States that he wears tight shoes to work and that he sweats a lot. He wears thick socks to stay warm in his feet sweat. He is also concerned about discoloration to the distal aspect of the nail second digit right foot. Denies trauma.    Review of Systems  All other systems reviewed and are negative.      Objective:   Physical Exam: Vital signs are stable alert and oriented 3. Pulses are palpable. Neurologic sensorium is intact. Deep tendon reflexes are intact muscle strength is normal. Orthopedic evaluation shows all joints distal to the ankle for range of motion without crepitation. As mild mallet toe deformities bilateral. Cutaneous evaluation demonstrates maceration between the fourth and fifth digits of the bilateral foot. There is no signs of bacterial infection there is no skin breakdown to simple macerations. Radiographs taken today do not demonstrate any type of osseous involvement. 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        Assessment & Plan:  Interdigital maceration between the fourth and fifth toes basically due to possible fungus as well as possible hyperhidrosis. 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  Plan: I recommended aluminum chloride to help dry his feet. Also recommended Naftin gel to be applied between the toes regularly. And I recommended that he keep his nails cut short and filed thin particularly for the nails with the mallet toes. I'll follow-up with him  in a month or so to make sure that he is doing better.

## 2016-05-18 ENCOUNTER — Ambulatory Visit (INDEPENDENT_AMBULATORY_CARE_PROVIDER_SITE_OTHER)
Admission: RE | Admit: 2016-05-18 | Discharge: 2016-05-18 | Disposition: A | Discharge status: Home / Self Care | Payer: BLUE CROSS/BLUE SHIELD | Source: Ambulatory Visit | Attending: Internal Medicine | Admitting: Internal Medicine

## 2016-05-18 ENCOUNTER — Encounter: Payer: Self-pay | Admitting: Internal Medicine

## 2016-05-18 ENCOUNTER — Ambulatory Visit (INDEPENDENT_AMBULATORY_CARE_PROVIDER_SITE_OTHER): Payer: BLUE CROSS/BLUE SHIELD | Admitting: Internal Medicine

## 2016-05-18 ENCOUNTER — Other Ambulatory Visit (INDEPENDENT_AMBULATORY_CARE_PROVIDER_SITE_OTHER): Payer: BLUE CROSS/BLUE SHIELD

## 2016-05-18 VITALS — BP 110/70 | HR 72 | Temp 98.1°F | Ht 73.0 in | Wt 188.8 lb

## 2016-05-18 DIAGNOSIS — R05 Cough: Secondary | ICD-10-CM

## 2016-05-18 DIAGNOSIS — D869 Sarcoidosis, unspecified: Secondary | ICD-10-CM

## 2016-05-18 DIAGNOSIS — R7309 Other abnormal glucose: Secondary | ICD-10-CM | POA: Diagnosis not present

## 2016-05-18 DIAGNOSIS — E785 Hyperlipidemia, unspecified: Secondary | ICD-10-CM

## 2016-05-18 DIAGNOSIS — R7989 Other specified abnormal findings of blood chemistry: Secondary | ICD-10-CM | POA: Diagnosis not present

## 2016-05-18 DIAGNOSIS — R059 Cough, unspecified: Secondary | ICD-10-CM

## 2016-05-18 LAB — COMPREHENSIVE METABOLIC PANEL
ALBUMIN: 4.4 g/dL (ref 3.5–5.2)
ALK PHOS: 39 U/L (ref 39–117)
ALT: 49 U/L (ref 0–53)
AST: 36 U/L (ref 0–37)
BILIRUBIN TOTAL: 0.3 mg/dL (ref 0.2–1.2)
BUN: 20 mg/dL (ref 6–23)
CO2: 29 mEq/L (ref 19–32)
CREATININE: 1.17 mg/dL (ref 0.40–1.50)
Calcium: 9.4 mg/dL (ref 8.4–10.5)
Chloride: 104 mEq/L (ref 96–112)
GFR: 89.73 mL/min (ref >60.00)
GLUCOSE: 76 mg/dL (ref 70–99)
POTASSIUM: 3.5 meq/L (ref 3.5–5.1)
SODIUM: 139 meq/L (ref 135–145)
TOTAL PROTEIN: 7.6 g/dL (ref 6.0–8.3)

## 2016-05-18 LAB — LIPID PANEL
Cholesterol: 185 mg/dL (ref 0–200)
HDL: 28.8 mg/dL — AB (ref >39.00)
NONHDL: 156.57
Total CHOL/HDL Ratio: 6
Triglycerides: 380 mg/dL — ABNORMAL HIGH (ref 0.0–149.0)
VLDL: 76 mg/dL — ABNORMAL HIGH (ref 0.0–40.0)

## 2016-05-18 LAB — TSH: TSH: 1.42 u[IU]/mL (ref 0.35–4.50)

## 2016-05-18 LAB — CBC WITH DIFFERENTIAL/PLATELET
BASOS PCT: 0.5 % (ref 0.0–3.0)
Basophils Absolute: 0 10*3/uL (ref 0.0–0.1)
EOS ABS: 0 10*3/uL (ref 0.0–0.7)
Eosinophils Relative: 0.7 % (ref 0.0–5.0)
HCT: 41.6 % (ref 39.0–52.0)
HEMOGLOBIN: 14.1 g/dL (ref 13.0–17.0)
LYMPHS ABS: 1.9 10*3/uL (ref 0.7–4.0)
Lymphocytes Relative: 32.1 % (ref 12.0–46.0)
MCHC: 33.9 g/dL (ref 30.0–36.0)
MCV: 81.3 fl (ref 78.0–100.0)
MONO ABS: 0.7 10*3/uL (ref 0.1–1.0)
Monocytes Relative: 11.5 % (ref 3.0–12.0)
NEUTROS PCT: 55.2 % (ref 43.0–77.0)
Neutro Abs: 3.3 10*3/uL (ref 1.4–7.7)
Platelets: 295 10*3/uL (ref 150.0–400.0)
RBC: 5.11 Mil/uL (ref 4.22–5.81)
RDW: 13.5 % (ref 11.5–15.5)
WBC: 5.9 10*3/uL (ref 4.0–10.5)

## 2016-05-18 LAB — POCT EXHALED NITRIC OXIDE: FENO LEVEL (PPB): 6

## 2016-05-18 LAB — HEMOGLOBIN A1C: HEMOGLOBIN A1C: 5.9 % (ref 4.6–6.5)

## 2016-05-18 LAB — LDL CHOLESTEROL, DIRECT: LDL DIRECT: 124 mg/dL

## 2016-05-18 LAB — SEDIMENTATION RATE: Sed Rate: 12 mm/hr (ref 0–15)

## 2016-05-18 NOTE — Progress Notes (Signed)
Pre visit review using our clinic review tool, if applicable. No additional management support is needed unless otherwise documented below in the visit note. 

## 2016-05-18 NOTE — Progress Notes (Signed)
Subjective:  Patient ID: [name] [name], male    DOB: [birth year]  Age: 36 y.o. MRN: [id number]  CC: Cough   HPI [name] [name] presents for A 2 week history of cough that was initially productive of yellow phlegm but the phlegm is diminishing. He was recently seen at an urgent care center and a chest x-ray was not done but he was given a prescription for Amoxil which he has not started taking it. He denies chest pain, shortness of breath, wheezing, hemoptysis, weight loss, or night sweats.  Outpatient Medications Prior to Visit  Medication Sig Dispense Refill  . Naftifine HCl 1 % GEL Apply 1 application topically 2 (two) times daily. 90 g 3   No facility-administered medications prior to visit.     ROS Review of Systems  Constitutional: Negative.  Negative for appetite change, chills, diaphoresis, fatigue and unexpected weight change.  HENT: Negative.  Negative for trouble swallowing.   Eyes: Negative for visual disturbance.  Respiratory: Positive for cough. Negative for apnea, choking, chest tightness, shortness of breath, wheezing and stridor.   Cardiovascular: Negative.  Negative for chest pain, palpitations and leg swelling.  Gastrointestinal: Negative for abdominal pain, constipation, diarrhea, nausea and vomiting.  Endocrine: Negative.   Genitourinary: Negative.  Negative for difficulty urinating.  Musculoskeletal: Negative.  Negative for back pain, myalgias and neck stiffness.  Skin: Negative.  Negative for color change, pallor and rash.  Allergic/Immunologic: Negative.   Neurological: Negative.  Negative for dizziness, weakness, numbness and headaches.  Hematological: Negative.  Negative for adenopathy. Does not bruise/bleed easily.  Psychiatric/Behavioral: Negative.     Objective:  BP 110/70 (BP Location: Left Arm, Patient Position: Sitting, Cuff Size: Normal)   Pulse 72   Temp 98.1 F (36.7 C) (Oral)   Ht  (1.854 m)   Wt 188 lb 12 oz (85.6 kg)   SpO2 95%    BMI 24.90 kg/m   BP Readings from Last 3 Encounters:  05/18/16 110/70  04/04/16 (!) 119/54  08/21/13 130/80    Wt Readings from Last 3 Encounters:  05/18/16 188 lb 12 oz (85.6 kg)  04/04/16 190 lb (86.2 kg)  08/21/13 209 lb (94.8 kg)    Physical Exam  Constitutional: He is oriented to person, place, and time.  Non-toxic appearance. He does not have a sickly appearance. He does not appear ill. No distress.  HENT:  Mouth/Throat: Oropharynx is clear and moist. No oropharyngeal exudate.  Eyes: Conjunctivae are normal. Right eye exhibits no discharge. Left eye exhibits no discharge. No scleral icterus.  Neck: Normal range of motion. Neck supple. No JVD present. No tracheal deviation present. No thyromegaly present.  Cardiovascular: Normal rate, regular rhythm, normal heart sounds and intact distal pulses.  Exam reveals no gallop.   No murmur heard. Pulmonary/Chest: Effort normal. No accessory muscle usage or stridor. No tachypnea. No respiratory distress. He has no decreased breath sounds. He has no wheezes. He has rhonchi in the right middle field, the right lower field, the left middle field and the left lower field. He has no rales. He exhibits no tenderness.  Abdominal: Soft. Bowel sounds are normal. He exhibits no distension and no mass. There is no tenderness. There is no rebound and no guarding.  Musculoskeletal: Normal range of motion. He exhibits no edema, tenderness or deformity.  Lymphadenopathy:    He has no cervical adenopathy.  Neurological: He is oriented to person, place, and time.  Skin: Skin is warm and dry. No rash noted. He  is not diaphoretic. No erythema. No pallor.  Vitals reviewed.   Lab Results  Component Value Date   WBC 5.9 05/18/2016   HGB 14.1 05/18/2016   HCT 41.6 05/18/2016   PLT 295.0 05/18/2016   GLUCOSE 76 05/18/2016   CHOL 185 05/18/2016   TRIG 380.0 (H) 05/18/2016   HDL 28.80 (L) 05/18/2016   LDLDIRECT 124.0 05/18/2016   LDLCALC 152 (H)  08/01/2013   ALT 49 05/18/2016   AST 36 05/18/2016   NA 139 05/18/2016   K 3.5 05/18/2016   CL 104 05/18/2016   CREATININE 1.17 05/18/2016   BUN 20 05/18/2016   CO2 29 05/18/2016   TSH 1.42 05/18/2016   PSA 0.37 05/26/2011   INR 0.9 01/02/2007   HGBA1C 5.9 05/18/2016    Dg Chest 2 View  Result Date: 08/01/2013 CLINICAL DATA:  Cough and congestion EXAM: CHEST  2 VIEW COMPARISON:  01/04/11 FINDINGS: The heart size and mediastinal contours are within normal limits. Both lungs are clear. The visualized skeletal structures are unremarkable. IMPRESSION: No active cardiopulmonary disease. Electronically Signed   By: Alcide Clever M.D.   On: 08/01/2013 16:32    Assessment & Plan:   [name] [name] was seen today for cough.  Diagnoses and all orders for this visit:  Hyperlipidemia with target LDL less than 100- His Framingham risk was only 1% so I do not recommend that he take a statin for cardiovascular risk reduction -     Lipid panel; Future -     Comprehensive metabolic panel; Future -     CBC with Differential/Platelet; Future -     TSH; Future  Sarcoidosis (HCC)- his chest x-ray, sedimentation rate, and calcium level are all normal so I don't think his cough is indicative of a flareup of sarcoidosis. -     Comprehensive metabolic panel; Future -     CBC with Differential/Platelet; Future -     Sedimentation rate; Future -     DG Chest 2 View; Future  Other abnormal glucose- his A1c is down to 5.9%, he is prediabetic but no medications are needed to treat this. -     CBC with Differential/Platelet; Future -     Hemoglobin A1c; Future  Cough- his FENO score is low indicating he does not have eosinophilic inflammation and would not benefit from inhaled corticosteroid, his XRAY is normal, he has a few rhonchi so symptoms are most likely a URI. He does not want to take a cough suppressant but will start the amoxicillin as previously recommended. -     DG Chest 2 View; Future -     POCT  EXHALED NITRIC OXIDE   I am having [name] [name] maintain his Naftifine HCl and amoxicillin-clavulanate.  Meds ordered this encounter  Medications  . amoxicillin-clavulanate (AUGMENTIN) 875-125 MG tablet     Follow-up: No Follow-up on file.  Sanda Linger, MD

## 2016-05-19 ENCOUNTER — Encounter: Payer: Self-pay | Admitting: Internal Medicine

## 2016-05-21 NOTE — Patient Instructions (Signed)
Cough, Adult Coughing is a reflex that clears your throat and your airways. Coughing helps to heal and protect your lungs. It is normal to cough occasionally, but a cough that happens with other symptoms or lasts a long time may be a sign of a condition that needs treatment. A cough may last only 2-3 weeks (acute), or it may last longer than 8 weeks (chronic). What are the causes? Coughing is commonly caused by:  Breathing in substances that irritate your lungs.  A viral or bacterial respiratory infection.  Allergies.  Asthma.  Postnasal drip.  Smoking.  Acid backing up from the stomach into the esophagus (gastroesophageal reflux).  Certain medicines.  Chronic lung problems, including COPD (or rarely, lung cancer).  Other medical conditions such as heart failure.  Follow these instructions at home: Pay attention to any changes in your symptoms. Take these actions to help with your discomfort:  Take medicines only as told by your health care provider. ? If you were prescribed an antibiotic medicine, take it as told by your health care provider. Do not stop taking the antibiotic even if you start to feel better. ? Talk with your health care provider before you take a cough suppressant medicine.  Drink enough fluid to keep your urine clear or pale yellow.  If the air is dry, use a cold steam vaporizer or humidifier in your bedroom or your home to help loosen secretions.  Avoid anything that causes you to cough at work or at home.  If your cough is worse at night, try sleeping in a semi-upright position.  Avoid cigarette smoke. If you smoke, quit smoking. If you need help quitting, ask your health care provider.  Avoid caffeine.  Avoid alcohol.  Rest as needed.  Contact a health care provider if:  You have new symptoms.  You cough up pus.  Your cough does not get better after 2-3 weeks, or your cough gets worse.  You cannot control your cough with suppressant  medicines and you are losing sleep.  You develop pain that is getting worse or pain that is not controlled with pain medicines.  You have a fever.  You have unexplained weight loss.  You have night sweats. Get help right away if:  You cough up blood.  You have difficulty breathing.  Your heartbeat is very fast. This information is not intended to replace advice given to you by your health care provider. Make sure you discuss any questions you have with your health care provider. Document Released: 07/29/2010 Document Revised: 07/08/2015 Document Reviewed: 04/08/2014 [employer] Interactive Patient Education  2017 [employer]
